# Patient Record
Sex: Male | Born: 1985
Health system: Southern US, Community
[De-identification: ages and names within clinical notes are randomized; demographics above are authoritative.]

## PROBLEM LIST (undated history)

## (undated) DIAGNOSIS — I10 Essential (primary) hypertension: Secondary | ICD-10-CM

---

## 2014-12-07 ENCOUNTER — Emergency Department (INDEPENDENT_AMBULATORY_CARE_PROVIDER_SITE_OTHER)
Admission: EM | Admit: 2014-12-07 | Discharge: 2014-12-07 | Disposition: A | Discharge status: Home / Self Care | Payer: 59 | Source: Home / Self Care | Attending: Family Medicine | Admitting: Family Medicine

## 2014-12-07 ENCOUNTER — Encounter (HOSPITAL_COMMUNITY): Payer: Self-pay | Admitting: Emergency Medicine

## 2014-12-07 DIAGNOSIS — M722 Plantar fascial fibromatosis: Secondary | ICD-10-CM

## 2014-12-07 DIAGNOSIS — S93402A Sprain of unspecified ligament of left ankle, initial encounter: Secondary | ICD-10-CM

## 2014-12-07 NOTE — Discharge Instructions (Signed)
Ankle Sprain An ankle sprain is an injury to the strong, fibrous tissues (ligaments) that hold your ankle bones together.  HOME CARE   Put ice on your ankle for 1-2 days or as told by your doctor.  Put ice in a plastic bag.  Place a towel between your skin and the bag.  Leave the ice on for 15-20 minutes at a time, every 2 hours while you are awake.  Only take medicine as told by your doctor.  Raise (elevate) your injured ankle above the level of your heart as much as possible for 2-3 days.  Use crutches if your doctor tells you to. Slowly put your own weight on the affected ankle. Use the crutches until you can walk without pain.  If you have a plaster splint:  Do not rest it on anything harder than a pillow for 24 hours.  Do not put weight on it.  Do not get it wet.  Take it off to shower or bathe.  If given, use an elastic wrap or support stocking for support. Take the wrap off if your toes lose feeling (numb), tingle, or turn cold or blue.  If you have an air splint:  Add or let out air to make it comfortable.  Take it off at night and to shower and bathe.  Wiggle your toes and move your ankle up and down often while you are wearing it. GET HELP IF:  You have rapidly increasing bruising or puffiness (swelling).  Your toes feel very cold.  You lose feeling in your foot.  Your medicine does not help your pain. GET HELP RIGHT AWAY IF:   Your toes lose feeling (numb) or turn blue.  You have severe pain that is increasing. MAKE SURE YOU:   Understand these instructions.  Will watch your condition.  Will get help right away if you are not doing well or get worse. Document Released: 12/09/2007 Document Revised: 11/06/2013 Document Reviewed: 01/04/2012 [facility] Patient Information 2015 [place], [place]. This information is not intended to replace advice given to you by your health care provider. Make sure you discuss any questions you have with your health care  provider.  Plantar Fasciitis Full length insert with high arch support Plantar fasciitis is a common condition that causes foot pain. It is soreness (inflammation) of the band of tough fibrous tissue on the bottom of the foot that runs from the heel bone (calcaneus) to the ball of the foot. The cause of this soreness may be from excessive standing, poor fitting shoes, running on hard surfaces, being overweight, having an abnormal walk, or overuse (this is common in runners) of the painful foot or feet. It is also common in aerobic exercise dancers and ballet dancers. SYMPTOMS  Most people with plantar fasciitis complain of:  Severe pain in the morning on the bottom of their foot especially when taking the first steps out of bed. This pain recedes after a few minutes of walking.  Severe pain is experienced also during walking following a long period of inactivity.  Pain is worse when walking barefoot or up stairs DIAGNOSIS   Your caregiver will diagnose this condition by examining and feeling your foot.  Special tests such as X-rays of your foot, are usually not needed. PREVENTION   Consult a sports medicine professional before beginning a new exercise program.  Walking programs offer a good workout. With walking there is a lower chance of overuse injuries common to runners. There is less impact and  less jarring of the joints.  Begin all new exercise programs slowly. If problems or pain develop, decrease the amount of time or distance until you are at a comfortable level.  Wear good shoes and replace them regularly.  Stretch your foot and the heel cords at the back of the ankle (Achilles tendon) both before and after exercise.  Run or exercise on even surfaces that are not hard. For example, asphalt is better than pavement.  Do not run barefoot on hard surfaces.  If using a treadmill, vary the incline.  Do not continue to workout if you have foot or joint problems. Seek  professional help if they do not improve. HOME CARE INSTRUCTIONS   Avoid activities that cause you pain until you recover.  Use ice or cold packs on the problem or painful areas after working out.  Only take over-the-counter or prescription medicines for pain, discomfort, or fever as directed by your caregiver.  Soft shoe inserts or athletic shoes with air or gel sole cushions may be helpful.  If problems continue or become more severe, consult a sports medicine caregiver or your own health care provider. Cortisone is a potent anti-inflammatory medication that may be injected into the painful area. You can discuss this treatment with your caregiver. MAKE SURE YOU:   Understand these instructions.  Will watch your condition.  Will get help right away if you are not doing well or get worse. Document Released: 03/17/2001 Document Revised: 09/14/2011 Document Reviewed: 05/16/2008 [facility] Patient Information 2015 [place], [place]. This information is not intended to replace advice given to you by your health care provider. Make sure you discuss any questions you have with your health care provider.

## 2014-12-07 NOTE — ED Notes (Signed)
Pt states that he has had left ankle pain for a week now. Pt denies any injury or fall.

## 2014-12-07 NOTE — ED Provider Notes (Signed)
CSN: [id number]     Arrival date & time 12/07/14  1537 History   First MD Initiated Contact with Patient 12/07/14 1545     Chief Complaint  Patient presents with  . Ankle Pain   (Consider location/radiation/quality/duration/timing/severity/associated sxs/prior Treatment) HPI Comments: 29 year old male complaining of left ankle pain for one week. He states 1 day prior to this he "twisted it a little". The following day is when he experienced the pain. His job entails being on his feet at [employer] for 12 hours a day. In addition to the left medial ankle discomfort he also complains of plantar discomfort with weightbearing. It feels better when he is off weight such as during the night however, when first planning his foot on the floor in the morning he experiences severe plantar pain to the left foot.   History reviewed. No pertinent past medical history. History reviewed. No pertinent past surgical history. History reviewed. No pertinent family history. History  Substance Use Topics  . Smoking status: Current Some Day Smoker  . Smokeless tobacco: Not on file  . Alcohol Use: Yes    Review of Systems  Constitutional: Negative.   Respiratory: Negative.   Genitourinary: Negative.   Musculoskeletal:       As per HPI  Skin: Negative.   Neurological: Negative for numbness and headaches.  All other systems reviewed and are negative.   Allergies  Review of patient's allergies indicates no known allergies.  Home Medications   Prior to Admission medications   Not on File   BP 163/89 mmHg  Pulse 86  Temp(Src) 99.4 F (37.4 C) (Oral)  Resp 20  SpO2 100% Physical Exam  Constitutional: He is oriented to person, place, and time. He appears well-developed and well-nourished. No distress.  HENT:  Head: Normocephalic and atraumatic.  Eyes: EOM are normal. Left eye exhibits no discharge.  Neck: Normal range of motion. Neck supple.  Pulmonary/Chest: Effort normal. No respiratory  distress.  Musculoskeletal:  Left ankle without swelling, discoloration or deformity. Full range of motion. No tenderness to the medial or lateral aspect of the ankle. There is tenderness to the plantar aspect near the heel with deep palpation. Pain is primarily experienced with weightbearing. There is no bony tenderness of the ankle or foot. Distal neurovascular motor sensory is intact. Pedal pulses 2+. Capillary refill is brisk.  Neurological: He is alert and oriented to person, place, and time. No cranial nerve deficit.  Skin: Skin is warm and dry.  Psychiatric: He has a normal mood and affect.  Nursing note and vitals reviewed.   ED Course  Procedures (including critical care time) Labs Review Labs Reviewed - No data to display  Imaging Review No results found.   MDM   1. Plantar fasciitis of left foot   2. Ankle sprain, left, initial encounter     Full length insert with high arch support Limit wt bearing as much as possible Ice as directed Motrin as dir ASO for ankle May need to see podiatrist if not improving If ankle pain persists or worse may return.   Hayden Rasmussenavid Chan Sheahan, NP 12/07/14 [phone]

## 2015-02-18 ENCOUNTER — Emergency Department (INDEPENDENT_AMBULATORY_CARE_PROVIDER_SITE_OTHER)
Admission: EM | Admit: 2015-02-18 | Discharge: 2015-02-18 | Disposition: A | Discharge status: Home / Self Care | Payer: 59 | Source: Home / Self Care | Attending: Family Medicine | Admitting: Family Medicine

## 2015-02-18 DIAGNOSIS — L84 Corns and callosities: Secondary | ICD-10-CM | POA: Diagnosis not present

## 2015-02-18 NOTE — Discharge Instructions (Signed)
Soak in warm salt water at least once a day for 7-10 days, bacitracin ointment twice a day, return as needed.

## 2015-02-18 NOTE — ED Provider Notes (Signed)
CSN: [id number]     Arrival date & time 02/18/15  1551 History   First MD Initiated Contact with Patient 02/18/15 1816     Chief Complaint  Patient presents with  . Foot Pain   (Consider location/radiation/quality/duration/timing/severity/associated sxs/prior Treatment) Patient is a 29 y.o. male presenting with lower extremity pain. The history is provided by the patient.  Foot Pain This is a new problem. The current episode started more than 1 week ago (present for more than 75mo ago worse recently.). The problem has been gradually worsening.    No past medical history on file. No past surgical history on file. No family history on file. Social History  Substance Use Topics  . Smoking status: Current Some Day Smoker  . Smokeless tobacco: Not on file  . Alcohol Use: Yes    Review of Systems  Constitutional: Negative.   Musculoskeletal: Positive for gait problem.  Skin: Positive for wound.    Allergies  Review of patient's allergies indicates no known allergies.  Home Medications   Prior to Admission medications   Not on File   BP 179/99 mmHg  Pulse 75  Temp(Src) 99.7 F (37.6 C) (Oral)  Resp 20  SpO2 100% Physical Exam  Constitutional: He is oriented to person, place, and time. He appears well-developed and well-nourished. He appears distressed.  Musculoskeletal: He exhibits tenderness.  Fluctuant tender corn with assoc callous to plantar surface of right foot.  Neurological: He is alert and oriented to person, place, and time.  Skin: Skin is warm and dry. No erythema.  Nursing note and vitals reviewed.   ED Course  Debridement Date/Time: 02/18/2015 6:44 PM Performed by: Linna Hoff Authorized by: Bradd Canary D Consent: Verbal consent obtained. Risks and benefits: risks, benefits and alternatives were discussed Consent given by: patient Preparation: Patient was prepped and draped in the usual sterile fashion. Local anesthesia used: no Patient sedated:  no Patient tolerance: Patient tolerated the procedure well with no immediate complications Comments: Copious purulent fluid expressed from callous site, betadine soaked, dsd.   (including critical care time) Labs Review Labs Reviewed - No data to display  Imaging Review No results found.   MDM   1. Callus of foot        Linna Hoff, MD 02/18/15 [phone]

## 2015-02-18 NOTE — ED Notes (Signed)
Pt  Has    A  Callus      On the  Bottom of     The       r  Foot  That  Has  Been there     X  4  Weeks

## 2017-02-26 ENCOUNTER — Encounter (HOSPITAL_COMMUNITY): Payer: Self-pay | Admitting: *Deleted

## 2017-02-26 ENCOUNTER — Ambulatory Visit (HOSPITAL_COMMUNITY)
Admission: EM | Admit: 2017-02-26 | Discharge: 2017-02-26 | Disposition: A | Discharge status: Home / Self Care | Payer: 59

## 2017-02-26 DIAGNOSIS — K5901 Slow transit constipation: Secondary | ICD-10-CM

## 2017-02-26 DIAGNOSIS — R109 Unspecified abdominal pain: Secondary | ICD-10-CM

## 2017-02-26 DIAGNOSIS — K594 Anal spasm: Secondary | ICD-10-CM

## 2017-02-26 NOTE — ED Provider Notes (Signed)
[facility]    CSN: [id number] Arrival date & time: 02/26/17  1310     History   Chief Complaint Chief Complaint  Patient presents with  . Abdominal Cramping    HPI Darius Joyce is a 31 y.o. male.   Obese 31 year old male complaining of lower abdominal pain and cramping yesterday. He was straining to have a bowel movement and describes muscle spasms in the rectum. Discomfort a little bit better today. He states he feels like he has been having normal bowel movements but has a history of constipation.      History reviewed. No pertinent past medical history.  There are no active problems to display for this patient.   History reviewed. No pertinent surgical history.     Home Medications    Prior to Admission medications   Not on File    Family History History reviewed. No pertinent family history.  Social History Social History  Substance Use Topics  . Smoking status: Former Games developer  . Smokeless tobacco: Never Used  . Alcohol use Yes     Allergies   Patient has no known allergies.   Review of Systems Review of Systems  Constitutional: Negative.   Respiratory: Negative.   Cardiovascular: Negative.   Gastrointestinal: Positive for abdominal pain. Negative for blood in stool, nausea and vomiting.  Musculoskeletal: Negative.   Neurological: Negative.   All other systems reviewed and are negative.    Physical Exam Triage Vital Signs ED Triage Vitals  Enc Vitals Group     BP 02/26/17 1358 (!) 149/89     Pulse Rate 02/26/17 1358 79     Resp 02/26/17 1358 17     Temp 02/26/17 1358 98 F (36.7 C)     Temp Source 02/26/17 1358 Oral     SpO2 02/26/17 1358 99 %     Weight --      Height --      Head Circumference --      Peak Flow --      Pain Score 02/26/17 1356 4     Pain Loc --      Pain Edu? --      Excl. in GC? --    No data found.   Updated Vital Signs BP (!) 149/89 (BP Location: Left Arm)   Pulse 79   Temp 98 F  (36.7 C) (Oral)   Resp 17   SpO2 99%   Visual Acuity Right Eye Distance:   Left Eye Distance:   Bilateral Distance:    Right Eye Near:   Left Eye Near:    Bilateral Near:     Physical Exam  Constitutional: He is oriented to person, place, and time. He appears well-developed and well-nourished. No distress.  Neck: Neck supple.  Cardiovascular: Normal rate.   Pulmonary/Chest: Effort normal.  Abdominal: Soft. Bowel sounds are normal. He exhibits no distension.  Mild tenderness across lower abdomen. No guarding or rebound. Most of abdomen percusses dull to flat.  Neurological: He is alert and oriented to person, place, and time.  Skin: Skin is warm.  Psychiatric: He has a normal mood and affect.  Nursing note and vitals reviewed.    UC Treatments / Results  Labs (all labs ordered are listed, but only abnormal results are displayed) Labs Reviewed - No data to display  EKG  EKG Interpretation None       Radiology No results found.  Procedures Procedures (including critical care time)  Medications Ordered in UC  Medications - No data to display   Initial Impression / Assessment and Plan / UC Course  I have reviewed the triage vital signs and the nursing notes.  Pertinent labs & imaging results that were available during my care of the patient were reviewed by me and considered in my medical decision making (see chart for details).    Take the MiraLAX as directed. 3 glasses and one and half hours. If results are not adequate in 6 hours repeat 2 glasses and again in 6 hours repeat 2 glasses if needed. Increase the fiber in your diet.    Final Clinical Impressions(s) / UC Diagnoses   Final diagnoses:  Abdominal cramping  Slow transit constipation  Paroxysmal proctalgia    New Prescriptions New Prescriptions   No medications on file     Controlled Substance Prescriptions [place] Controlled Substance Registry consulted? Not Applicable   Hayden Rasmussen,  NP 02/26/17 [phone]

## 2017-02-26 NOTE — Discharge Instructions (Signed)
Take the MiraLAX as directed. 3 glasses and one and half hours. If results are not adequate in 6 hours repeat 2 glasses and again in 6 hours repeat 2 glasses if needed. Increase the fiber in your diet.

## 2017-02-26 NOTE — ED Triage Notes (Signed)
Patient reports after going to the bathroom yesterday he developed lower abdominal cramping. Reports it was sharp in nature and radiated to back and anus. States this pain has went away but he is still having intermittent abdominal cramping.

## 2019-05-31 ENCOUNTER — Other Ambulatory Visit: Payer: Self-pay

## 2019-05-31 DIAGNOSIS — Z20822 Contact with and (suspected) exposure to covid-19: Secondary | ICD-10-CM

## 2019-06-02 LAB — NOVEL CORONAVIRUS, NAA: SARS-CoV-2, NAA: NOT DETECTED

## 2020-02-17 ENCOUNTER — Other Ambulatory Visit: Payer: Self-pay

## 2020-02-17 ENCOUNTER — Encounter: Payer: Self-pay | Admitting: *Deleted

## 2020-02-17 ENCOUNTER — Ambulatory Visit
Admission: EM | Admit: 2020-02-17 | Discharge: 2020-02-17 | Disposition: A | Discharge status: Home / Self Care | Payer: Self-pay | Attending: Emergency Medicine | Admitting: Emergency Medicine

## 2020-02-17 DIAGNOSIS — R112 Nausea with vomiting, unspecified: Secondary | ICD-10-CM

## 2020-02-17 MED ORDER — AFRIN NASAL SPRAY 0.05 % NA SOLN: 1.0000 | Freq: Two times a day (BID) | NASAL | 0 refills | Status: DC

## 2020-02-17 MED ORDER — CETIRIZINE HCL 10 MG PO TABS: 10.0000 mg | ORAL_TABLET | Freq: Every day | ORAL | 0 refills | Status: DC

## 2020-02-17 MED ORDER — BENZONATATE 100 MG PO CAPS: 100.0000 mg | ORAL_CAPSULE | Freq: Three times a day (TID) | ORAL | 0 refills | Status: DC

## 2020-02-17 MED ORDER — ONDANSETRON 4 MG PO TBDP: 4.0000 mg | ORAL_TABLET | Freq: Three times a day (TID) | ORAL | 0 refills | Status: DC | PRN

## 2020-02-17 NOTE — Discharge Instructions (Addendum)
Your COVID test is pending - it is important to quarantine / isolate at home until your results are back. °If you test positive and would like further evaluation for persistent or worsening symptoms, you may schedule an E-visit or virtual (video) visit throughout the [facility] MyChart app or website. ° °PLEASE NOTE: If you develop severe chest pain or shortness of breath please go to the ER or call 9-1-1 for further evaluation --> DO NOT schedule electronic or virtual visits for this. °Please call our office for further guidance / recommendations as needed. ° °For information about the Covid vaccine, please visit [email or web address] °

## 2020-02-17 NOTE — ED Provider Notes (Signed)
[facility]    CSN: [id number] Arrival date & time: 02/17/20  1349      History   Chief Complaint Chief Complaint  Patient presents with  . Emesis  . Epistaxis    HPI Darius Joyce is a 34 y.o. male  Presenting for emesis x3 since yesterday.  Patient notes a little bit of watery diarrhea without blood or melena for last few days as well.  No abdominal pain, chest pain, cough, difficulty breathing.  States that after vomiting yesterday he had a single episode of nosebleed: Controlled with direct pressure.  Has not occurred since.  Able to tolerate liquids orally.  History reviewed. No pertinent past medical history.  There are no problems to display for this patient.   History reviewed. No pertinent surgical history.     Home Medications    Prior to Admission medications   Medication Sig Start Date End Date Taking? Authorizing Provider  benzonatate (TESSALON) 100 MG capsule Take 1 capsule (100 mg total) by mouth every 8 (eight) hours. 02/17/20   Hall-Potvin, [place], PA-C  cetirizine (ZYRTEC ALLERGY) 10 MG tablet Take 1 tablet (10 mg total) by mouth daily. 02/17/20   Hall-Potvin, [place], PA-C  ondansetron (ZOFRAN ODT) 4 MG disintegrating tablet Take 1 tablet (4 mg total) by mouth every 8 (eight) hours as needed for nausea or vomiting. 02/17/20   Hall-Potvin, [place], PA-C  oxymetazoline (AFRIN NASAL SPRAY) 0.05 % nasal spray Place 1 spray into both nostrils 2 (two) times daily. 02/17/20   Hall-Potvin, [place], PA-C    Family History Family History  Problem Relation Age of Onset  . Hypertension Mother     Social History Social History   Tobacco Use  . Smoking status: Former Games developer  . Smokeless tobacco: Never Used  Vaping Use  . Vaping Use: Never used  Substance Use Topics  . Alcohol use: Yes    Comment: rarely  . Drug use: Yes    Types: Marijuana     Allergies   Patient has no known allergies.   Review of Systems As per  HPI   Physical Exam Triage Vital Signs ED Triage Vitals  Enc Vitals Group     BP 02/17/20 1518 (!) 173/115     Pulse Rate 02/17/20 1518 70     Resp 02/17/20 1518 18     Temp 02/17/20 1518 100.2 F (37.9 C)     Temp Source 02/17/20 1518 Oral     SpO2 02/17/20 1518 97 %     Weight --      Height --      Head Circumference --      Peak Flow --      Pain Score 02/17/20 1531 0     Pain Loc --      Pain Edu? --      Excl. in GC? --    No data found.  Updated Vital Signs BP (S) (!) 177/107 Comment: Reports recent HTN; no meds prescribed  Pulse 70   Temp 100.2 F (37.9 C) (Oral)   Resp 18   SpO2 97%   Visual Acuity Right Eye Distance:   Left Eye Distance:   Bilateral Distance:    Right Eye Near:   Left Eye Near:    Bilateral Near:     Physical Exam Constitutional:      General: He is not in acute distress.    Appearance: He is obese. He is not ill-appearing.  HENT:     Head:  Normocephalic and atraumatic.     Nose: Nose normal.     Mouth/Throat:     Mouth: Mucous membranes are moist.     Pharynx: Oropharynx is clear.  Eyes:     General: No scleral icterus.    Pupils: Pupils are equal, round, and reactive to light.  Cardiovascular:     Rate and Rhythm: Normal rate and regular rhythm.  Pulmonary:     Effort: Pulmonary effort is normal. No respiratory distress.     Breath sounds: No wheezing.  Abdominal:     General: Bowel sounds are normal.     Palpations: Abdomen is soft.     Tenderness: There is no abdominal tenderness.  Skin:    Coloration: Skin is not jaundiced or pale.  Neurological:     Mental Status: He is alert and oriented to person, place, and time.      UC Treatments / Results  Labs (all labs ordered are listed, but only abnormal results are displayed) Labs Reviewed  NOVEL CORONAVIRUS, NAA    EKG   Radiology No results found.  Procedures Procedures (including critical care time)  Medications Ordered in UC Medications - No data  to display  Initial Impression / Assessment and Plan / UC Course  I have reviewed the triage vital signs and the nursing notes.  Pertinent labs & imaging results that were available during my care of the patient were reviewed by me and considered in my medical decision making (see chart for details).     Patient afebrile, nontoxic, with SpO2 97%.  Covid PCR pending.  Patient to quarantine until results are back.  We will treat supportively as outlined below.  Return precautions discussed, patient verbalized understanding and is agreeable to plan. Final Clinical Impressions(s) / UC Diagnoses   Final diagnoses:  Nausea and vomiting, intractability of vomiting not specified, unspecified vomiting type     Discharge Instructions     Your COVID test is pending - it is important to quarantine / isolate at home until your results are back. If you test positive and would like further evaluation for persistent or worsening symptoms, you may schedule an E-visit or virtual (video) visit throughout the [facility] app or website.  PLEASE NOTE: If you develop severe chest pain or shortness of breath please go to the ER or call 9-1-1 for further evaluation --> DO NOT schedule electronic or virtual visits for this. Please call our office for further guidance / recommendations as needed.  For information about the Covid vaccine, please visit [email or web address]    ED Prescriptions    Medication Sig Dispense Auth. Provider   benzonatate (TESSALON) 100 MG capsule Take 1 capsule (100 mg total) by mouth every 8 (eight) hours. 21 capsule Hall-Potvin, [place], PA-C   ondansetron (ZOFRAN ODT) 4 MG disintegrating tablet Take 1 tablet (4 mg total) by mouth every 8 (eight) hours as needed for nausea or vomiting. 21 tablet Hall-Potvin, [place], PA-C   cetirizine (ZYRTEC ALLERGY) 10 MG tablet Take 1 tablet (10 mg total) by mouth daily. 30 tablet Hall-Potvin, [place], PA-C   oxymetazoline (AFRIN  NASAL SPRAY) 0.05 % nasal spray Place 1 spray into both nostrils 2 (two) times daily. 30 mL Hall-Potvin, [place], PA-C     PDMP not reviewed this encounter.   Hall-Potvin, [place], [place] 02/18/20 1339

## 2020-02-17 NOTE — ED Triage Notes (Signed)
Reports vomiting x 3 yesterday; reports "little bit of diarrhea" 2 days ago and today.  States one vomiting episode caused epistaxis.  No epistaxis.  States feeling better today, but describes "not feeling 100%".  Denies pain.

## 2020-02-18 LAB — SARS-COV-2, NAA 2 DAY TAT

## 2020-02-18 LAB — NOVEL CORONAVIRUS, NAA: SARS-CoV-2, NAA: DETECTED — AB

## 2020-02-19 ENCOUNTER — Telehealth (HOSPITAL_COMMUNITY): Payer: Self-pay | Admitting: Emergency Medicine

## 2020-02-19 MED ORDER — ONDANSETRON 4 MG PO TBDP: 4.0000 mg | ORAL_TABLET | Freq: Three times a day (TID) | ORAL | 0 refills | Status: DC | PRN

## 2020-02-19 MED ORDER — CETIRIZINE HCL 10 MG PO TABS: 10.0000 mg | ORAL_TABLET | Freq: Every day | ORAL | 0 refills | Status: DC

## 2020-02-19 MED ORDER — BENZONATATE 100 MG PO CAPS: 100.0000 mg | ORAL_CAPSULE | Freq: Three times a day (TID) | ORAL | 0 refills | Status: DC

## 2020-02-19 MED ORDER — AFRIN NASAL SPRAY 0.05 % NA SOLN: 1.0000 | Freq: Two times a day (BID) | NASAL | 0 refills | Status: DC

## 2020-02-19 NOTE — Telephone Encounter (Signed)
Patient called for COVID results and states he needs his medicines sent to another pharmacy.  Resent to pharmacy of request

## 2020-02-27 ENCOUNTER — Other Ambulatory Visit: Payer: Self-pay

## 2020-02-27 DIAGNOSIS — Z20822 Contact with and (suspected) exposure to covid-19: Secondary | ICD-10-CM

## 2020-02-28 LAB — SPECIMEN STATUS REPORT

## 2020-02-28 LAB — SARS-COV-2, NAA 2 DAY TAT

## 2020-02-28 LAB — NOVEL CORONAVIRUS, NAA: SARS-CoV-2, NAA: DETECTED — AB

## 2020-03-13 ENCOUNTER — Other Ambulatory Visit: Payer: Self-pay

## 2020-03-13 ENCOUNTER — Other Ambulatory Visit: Payer: Self-pay | Admitting: Critical Care Medicine

## 2020-03-13 ENCOUNTER — Inpatient Hospital Stay: Payer: Self-pay | Admitting: Physician Assistant

## 2020-03-13 DIAGNOSIS — Z20822 Contact with and (suspected) exposure to covid-19: Secondary | ICD-10-CM

## 2020-03-13 NOTE — Progress Notes (Deleted)
Patient ID: Darius Joyce, male   DOB: 02-Feb-1986, 34 y.o.   MRN: [id number]   Virtual Visit via Telephone Note  I connected with Darius Joyce on 03/14/20 at  8:50 AM EDT by telephone and verified that I am speaking with the correct person using two identifiers.   I discussed the limitations, risks, security and privacy concerns of performing an evaluation and management service by telephone and the availability of in person appointments. I also discussed with the patient that there may be a patient responsible charge related to this service. The patient expressed understanding and agreed to proceed.   History of Present Illness: After ED visit 02/17/2020 then tested Positive for Covid 19 02/27/2020.  From ED note 8/14: Presenting for emesis x3 since yesterday.  Patient notes a little bit of watery diarrhea without blood or melena for last few days as well.  No abdominal pain, chest pain, cough, difficulty breathing.  States that after vomiting yesterday he had a single episode of nosebleed: Controlled with direct pressure.  Has not occurred since.  Able to tolerate liquids orally.    Observations/Objective:   Assessment and Plan:   Follow Up Instructions:    I discussed the assessment and treatment plan with the patient. The patient was provided an opportunity to ask questions and all were answered. The patient agreed with the plan and demonstrated an understanding of the instructions.   The patient was advised to call back or seek an in-person evaluation if the symptoms worsen or if the condition fails to improve as anticipated.  I provided *** minutes of non-face-to-face time during this encounter.   Georgian Co, PA-C

## 2020-03-14 ENCOUNTER — Inpatient Hospital Stay: Payer: Self-pay | Admitting: Physician Assistant

## 2020-03-15 LAB — SARS-COV-2, NAA 2 DAY TAT

## 2020-03-15 LAB — NOVEL CORONAVIRUS, NAA: SARS-CoV-2, NAA: NOT DETECTED

## 2020-12-18 ENCOUNTER — Ambulatory Visit (HOSPITAL_COMMUNITY)
Admission: EM | Admit: 2020-12-18 | Discharge: 2020-12-18 | Disposition: A | Discharge status: Home / Self Care | Payer: Self-pay | Attending: Internal Medicine | Admitting: Internal Medicine

## 2020-12-18 ENCOUNTER — Encounter (HOSPITAL_COMMUNITY): Payer: Self-pay

## 2020-12-18 ENCOUNTER — Other Ambulatory Visit: Payer: Self-pay

## 2020-12-18 ENCOUNTER — Ambulatory Visit (INDEPENDENT_AMBULATORY_CARE_PROVIDER_SITE_OTHER): Payer: Self-pay

## 2020-12-18 DIAGNOSIS — J189 Pneumonia, unspecified organism: Secondary | ICD-10-CM

## 2020-12-18 DIAGNOSIS — M940 Chondrocostal junction syndrome [Tietze]: Secondary | ICD-10-CM

## 2020-12-18 DIAGNOSIS — R0602 Shortness of breath: Secondary | ICD-10-CM

## 2020-12-18 MED ORDER — AMOXICILLIN-POT CLAVULANATE 875-125 MG PO TABS: 1.0000 | ORAL_TABLET | Freq: Two times a day (BID) | ORAL | 0 refills | Status: DC

## 2020-12-18 NOTE — ED Provider Notes (Signed)
[facility]    CSN: [id number] Arrival date & time: 12/18/20  1350      History   Chief Complaint Chief Complaint  Patient presents with   Chest Pain   Shortness of Breath    HPI Darius Joyce is a 35 y.o. male.   Patient here for evaluation of left-sided chest pain that started this AM.  Reports having similar pain on Thursday but states symptoms resolved on their own.  Reports pain worse with movement and that "it feels like something stabbing or punching chest".  Denies any recent sick contacts.  Denies any trauma, injury, or other precipitating event.  Denies any specific alleviating or aggravating factors.  Denies any fevers, chest pain, shortness of breath, N/V/D, numbness, tingling, weakness, abdominal pain, or headaches.     The history is provided by the patient.  Chest Pain Associated symptoms: shortness of breath   Shortness of Breath Associated symptoms: chest pain    History reviewed. No pertinent past medical history.  There are no problems to display for this patient.   History reviewed. No pertinent surgical history.     Home Medications    Prior to Admission medications   Medication Sig Start Date End Date Taking? Authorizing Provider  amoxicillin-clavulanate (AUGMENTIN) 875-125 MG tablet Take 1 tablet by mouth every 12 (twelve) hours for 5 days. 12/18/20 12/23/20 Yes Ivette Loyal, NP  benzonatate (TESSALON) 100 MG capsule Take 1 capsule (100 mg total) by mouth every 8 (eight) hours. 02/19/20   Merrilee Jansky, MD  cetirizine (ZYRTEC ALLERGY) 10 MG tablet Take 1 tablet (10 mg total) by mouth daily. 02/19/20   Merrilee Jansky, MD  ondansetron (ZOFRAN ODT) 4 MG disintegrating tablet Take 1 tablet (4 mg total) by mouth every 8 (eight) hours as needed for nausea or vomiting. 02/19/20   Lamptey, Britta Mccreedy, MD  oxymetazoline (AFRIN NASAL SPRAY) 0.05 % nasal spray Place 1 spray into both nostrils 2 (two) times daily. 02/19/20   Lamptey, Britta Mccreedy,  MD    Family History Family History  Problem Relation Age of Onset   Hypertension Mother    Hypertension Father     Social History Social History   Tobacco Use   Smoking status: Former    Pack years: 0.00   Smokeless tobacco: Never  Vaping Use   Vaping Use: Never used  Substance Use Topics   Alcohol use: Yes    Comment: rarely   Drug use: Yes    Types: Marijuana    Comment: smokes daily     Allergies   Patient has no known allergies.   Review of Systems Review of Systems  Respiratory:  Positive for shortness of breath.   Cardiovascular:  Positive for chest pain.  All other systems reviewed and are negative.   Physical Exam Triage Vital Signs ED Triage Vitals  Enc Vitals Group     BP 12/18/20 1410 (!) 167/97     Pulse Rate 12/18/20 1410 81     Resp 12/18/20 1410 18     Temp 12/18/20 1410 (!) 97 F (36.1 C)     Temp src --      SpO2 12/18/20 1410 99 %     Weight --      Height --      Head Circumference --      Peak Flow --      Pain Score 12/18/20 1403 5     Pain Loc --  Pain Edu? --      Excl. in GC? --    No data found.  Updated Vital Signs BP (!) 167/97   Pulse 81   Temp (!) 97 F (36.1 C)   Resp 18   SpO2 99%   Visual Acuity Right Eye Distance:   Left Eye Distance:   Bilateral Distance:    Right Eye Near:   Left Eye Near:    Bilateral Near:     Physical Exam Vitals and nursing note reviewed.  Constitutional:      General: He is not in acute distress.    Appearance: Normal appearance. He is not ill-appearing, toxic-appearing or diaphoretic.  HENT:     Head: Normocephalic and atraumatic.  Eyes:     Conjunctiva/sclera: Conjunctivae normal.  Cardiovascular:     Rate and Rhythm: Normal rate and regular rhythm.     Pulses: Normal pulses.          Carotid pulses are 2+ on the right side and 2+ on the left side.      Radial pulses are 2+ on the right side and 2+ on the left side.       Dorsalis pedis pulses are 2+ on the right  side and 2+ on the left side.       Posterior tibial pulses are 2+ on the right side and 2+ on the left side.     Heart sounds: Normal heart sounds.  Pulmonary:     Effort: Pulmonary effort is normal.     Breath sounds: Normal breath sounds. No decreased breath sounds, wheezing, rhonchi or rales.  Chest:     Chest wall: Tenderness (left upper chest) present.  Abdominal:     General: Abdomen is flat.  Musculoskeletal:        General: Normal range of motion.     Cervical back: Normal range of motion.  Skin:    General: Skin is warm and dry.  Neurological:     General: No focal deficit present.     Mental Status: He is alert and oriented to person, place, and time.  Psychiatric:        Mood and Affect: Mood normal.     UC Treatments / Results  Labs (all labs ordered are listed, but only abnormal results are displayed) Labs Reviewed - No data to display  EKG   Radiology DG Chest 2 View  Result Date: 12/18/2020 CLINICAL DATA:  Short of breath EXAM: CHEST - 2 VIEW COMPARISON:  None. FINDINGS: Multifocal airspace disease bilaterally left greater than right. No pleural effusion. Lung volume normal. Heart size and vascularity normal. IMPRESSION: Extensive multifocal airspace disease left greater than right most likely pneumonia. Electronically Signed   By: Marlan Palau M.D.   On: 12/18/2020 14:56    Procedures Procedures (including critical care time)  Medications Ordered in UC Medications - No data to display  Initial Impression / Assessment and Plan / UC Course  I have reviewed the triage vital signs and the nursing notes.  Pertinent labs & imaging results that were available during my care of the patient were reviewed by me and considered in my medical decision making (see chart for details).    EKG normal sinus rhythm with normal rate and rhythm.  X-ray shows extensive multifocal airspace disease left greater than right most likely pneumonia.  We will treat with Augmentin  twice a day for the next 5 days.  Patient also likely has costochondritis and will treat with  ibuprofen as needed for pain.  Encourage fluids and rest.  Discussed conservative symptom management as described in discharge instructions.  Recommend following up in 1 month for repeat chest x-ray.  Follow-up with primary care as needed.  Final Clinical Impressions(s) / UC Diagnoses   Final diagnoses:  Pneumonia due to infectious organism, unspecified laterality, unspecified part of lung  Costochondritis     Discharge Instructions      Take the Augmentin twice a day for the next 5 days.   You can take Tylenol and/or Ibuprofen as needed for fever reduction and pain relief.    For cough: honey 1/2 to 1 teaspoon (you can dilute the honey in water or another fluid).  You can also use guaifenesin and dextromethorphan for cough. You can use a humidifier for chest congestion and cough.  If you don't have a humidifier, you can sit in the bathroom with the hot shower running.     For sore throat: try warm salt water gargles, cepacol lozenges, throat spray, warm tea or water with lemon/honey, popsicles or ice, or OTC cold relief medicine for throat discomfort.    For congestion: take a daily anti-histamine like Zyrtec, Claritin, and a oral decongestant, such as pseudoephedrine.  You can also use Flonase 1-2 sprays in each nostril daily.    It is important to stay hydrated: drink plenty of fluids (water, gatorade/powerade/pedialyte, juices, or teas) to keep your throat moisturized and help further relieve irritation/discomfort.   Return or go to the Emergency Department if symptoms worsen or do not improve in the next few days.      ED Prescriptions     Medication Sig Dispense Auth. Provider   amoxicillin-clavulanate (AUGMENTIN) 875-125 MG tablet Take 1 tablet by mouth every 12 (twelve) hours for 5 days. 10 tablet Ivette Loyal, NP      PDMP not reviewed this encounter.   Ivette Loyal,  NP 12/18/20 [phone]

## 2020-12-18 NOTE — Discharge Instructions (Addendum)
Take the Augmentin twice a day for the next 5 days.   You can take Tylenol and/or Ibuprofen as needed for fever reduction and pain relief.    For cough: honey 1/2 to 1 teaspoon (you can dilute the honey in water or another fluid).  You can also use guaifenesin and dextromethorphan for cough. You can use a humidifier for chest congestion and cough.  If you don't have a humidifier, you can sit in the bathroom with the hot shower running.     For sore throat: try warm salt water gargles, cepacol lozenges, throat spray, warm tea or water with lemon/honey, popsicles or ice, or OTC cold relief medicine for throat discomfort.    For congestion: take a daily anti-histamine like Zyrtec, Claritin, and a oral decongestant, such as pseudoephedrine.  You can also use Flonase 1-2 sprays in each nostril daily.    It is important to stay hydrated: drink plenty of fluids (water, gatorade/powerade/pedialyte, juices, or teas) to keep your throat moisturized and help further relieve irritation/discomfort.   Return or go to the Emergency Department if symptoms worsen or do not improve in the next few days.

## 2020-12-18 NOTE — ED Triage Notes (Signed)
Pt reports chest pain starting Thursday, which went away and came back this morning. States the pain comes and goes at random and that pain is left sided. States it feels like "something is punching my lung, it's hard to breathe". Pt does state pain at times is worse with deep inspiration but states he also feels it at times when not breathing.

## 2020-12-22 ENCOUNTER — Inpatient Hospital Stay (HOSPITAL_COMMUNITY)
Admission: EM | Admit: 2020-12-22 | Discharge: 2020-12-24 | DRG: 197 | Disposition: A | Discharge status: Home / Self Care | Payer: Self-pay | Attending: Family Medicine | Admitting: Family Medicine

## 2020-12-22 ENCOUNTER — Encounter (HOSPITAL_COMMUNITY): Payer: Self-pay | Admitting: *Deleted

## 2020-12-22 ENCOUNTER — Other Ambulatory Visit: Payer: Self-pay

## 2020-12-22 ENCOUNTER — Emergency Department (HOSPITAL_COMMUNITY): Payer: Self-pay

## 2020-12-22 DIAGNOSIS — R0600 Dyspnea, unspecified: Secondary | ICD-10-CM | POA: Diagnosis present

## 2020-12-22 DIAGNOSIS — I10 Essential (primary) hypertension: Secondary | ICD-10-CM

## 2020-12-22 DIAGNOSIS — R918 Other nonspecific abnormal finding of lung field: Secondary | ICD-10-CM

## 2020-12-22 DIAGNOSIS — Z8249 Family history of ischemic heart disease and other diseases of the circulatory system: Secondary | ICD-10-CM

## 2020-12-22 DIAGNOSIS — R0781 Pleurodynia: Secondary | ICD-10-CM | POA: Diagnosis present

## 2020-12-22 DIAGNOSIS — Z20822 Contact with and (suspected) exposure to covid-19: Secondary | ICD-10-CM | POA: Diagnosis present

## 2020-12-22 DIAGNOSIS — R590 Localized enlarged lymph nodes: Secondary | ICD-10-CM | POA: Diagnosis present

## 2020-12-22 DIAGNOSIS — Z87891 Personal history of nicotine dependence: Secondary | ICD-10-CM

## 2020-12-22 DIAGNOSIS — I509 Heart failure, unspecified: Secondary | ICD-10-CM

## 2020-12-22 DIAGNOSIS — D862 Sarcoidosis of lung with sarcoidosis of lymph nodes: Principal | ICD-10-CM | POA: Diagnosis present

## 2020-12-22 DIAGNOSIS — E559 Vitamin D deficiency, unspecified: Secondary | ICD-10-CM

## 2020-12-22 DIAGNOSIS — J84178 Other interstitial pulmonary diseases with fibrosis in diseases classified elsewhere: Secondary | ICD-10-CM | POA: Diagnosis present

## 2020-12-22 DIAGNOSIS — F1290 Cannabis use, unspecified, uncomplicated: Secondary | ICD-10-CM | POA: Diagnosis present

## 2020-12-22 DIAGNOSIS — Z6841 Body Mass Index (BMI) 40.0 and over, adult: Secondary | ICD-10-CM

## 2020-12-22 DIAGNOSIS — J189 Pneumonia, unspecified organism: Secondary | ICD-10-CM

## 2020-12-22 DIAGNOSIS — E669 Obesity, unspecified: Secondary | ICD-10-CM | POA: Diagnosis present

## 2020-12-22 DIAGNOSIS — Z419 Encounter for procedure for purposes other than remedying health state, unspecified: Secondary | ICD-10-CM

## 2020-12-22 DIAGNOSIS — J181 Lobar pneumonia, unspecified organism: Secondary | ICD-10-CM | POA: Diagnosis present

## 2020-12-22 LAB — CBC
HCT: 44.5 % (ref 39.0–52.0)
Hemoglobin: 14.9 g/dL (ref 13.0–17.0)
MCH: 28.7 pg (ref 26.0–34.0)
MCHC: 33.5 g/dL (ref 30.0–36.0)
MCV: 85.7 fL (ref 80.0–100.0)
Platelets: 329 10*3/uL (ref 150–400)
RBC: 5.19 MIL/uL (ref 4.22–5.81)
RDW: 13.8 % (ref 11.5–15.5)
WBC: 6 10*3/uL (ref 4.0–10.5)
nRBC: 0 % (ref 0.0–0.2)

## 2020-12-22 LAB — DIFFERENTIAL
Abs Immature Granulocytes: 0.02 10*3/uL (ref 0.00–0.07)
Basophils Absolute: 0 10*3/uL (ref 0.0–0.1)
Basophils Relative: 0 %
Eosinophils Absolute: 0.1 10*3/uL (ref 0.0–0.5)
Eosinophils Relative: 1 %
Immature Granulocytes: 0 %
Lymphocytes Relative: 14 %
Lymphs Abs: 1.2 10*3/uL (ref 0.7–4.0)
Monocytes Absolute: 1.2 10*3/uL — ABNORMAL HIGH (ref 0.1–1.0)
Monocytes Relative: 14 %
Neutro Abs: 5.8 10*3/uL (ref 1.7–7.7)
Neutrophils Relative %: 71 %

## 2020-12-22 LAB — RAPID URINE DRUG SCREEN, HOSP PERFORMED
Amphetamines: NOT DETECTED
Barbiturates: NOT DETECTED
Benzodiazepines: NOT DETECTED
Cocaine: NOT DETECTED
Opiates: NOT DETECTED
Tetrahydrocannabinol: POSITIVE — AB

## 2020-12-22 LAB — COMPREHENSIVE METABOLIC PANEL
ALT: 21 U/L (ref 0–44)
AST: 22 U/L (ref 15–41)
Albumin: 3.1 g/dL — ABNORMAL LOW (ref 3.5–5.0)
Alkaline Phosphatase: 62 U/L (ref 38–126)
Anion gap: 7 (ref 5–15)
BUN: 9 mg/dL (ref 6–20)
CO2: 25 mmol/L (ref 22–32)
Calcium: 8.6 mg/dL — ABNORMAL LOW (ref 8.9–10.3)
Chloride: 100 mmol/L (ref 98–111)
Creatinine, Ser: 0.86 mg/dL (ref 0.61–1.24)
GFR, Estimated: >60 mL/min (ref >60)
Glucose, Bld: 99 mg/dL (ref 70–99)
Potassium: 3.8 mmol/L (ref 3.5–5.1)
Sodium: 132 mmol/L — ABNORMAL LOW (ref 135–145)
Total Bilirubin: 0.6 mg/dL (ref 0.3–1.2)
Total Protein: 6.8 g/dL (ref 6.5–8.1)

## 2020-12-22 LAB — VITAMIN D 25 HYDROXY (VIT D DEFICIENCY, FRACTURES): Vit D, 25-Hydroxy: 13.67 ng/mL — ABNORMAL LOW (ref 30–100)

## 2020-12-22 LAB — URINALYSIS, ROUTINE W REFLEX MICROSCOPIC
Bilirubin Urine: NEGATIVE
Glucose, UA: NEGATIVE mg/dL
Ketones, ur: NEGATIVE mg/dL
Leukocytes,Ua: NEGATIVE
Nitrite: NEGATIVE
Protein, ur: NEGATIVE mg/dL
Specific Gravity, Urine: 1.01 (ref 1.005–1.030)
pH: 5.5 (ref 5.0–8.0)

## 2020-12-22 LAB — RESPIRATORY PANEL BY PCR

## 2020-12-22 LAB — HIV ANTIBODY (ROUTINE TESTING W REFLEX): HIV Screen 4th Generation wRfx: NONREACTIVE

## 2020-12-22 LAB — URINALYSIS, MICROSCOPIC (REFLEX): Bacteria, UA: NONE SEEN

## 2020-12-22 LAB — BASIC METABOLIC PANEL
Anion gap: 8 (ref 5–15)
BUN: 10 mg/dL (ref 6–20)
CO2: 27 mmol/L (ref 22–32)
Calcium: 9.2 mg/dL (ref 8.9–10.3)
Chloride: 104 mmol/L (ref 98–111)
Creatinine, Ser: 0.98 mg/dL (ref 0.61–1.24)
GFR, Estimated: >60 mL/min (ref >60)
Glucose, Bld: 119 mg/dL — ABNORMAL HIGH (ref 70–99)
Potassium: 4.1 mmol/L (ref 3.5–5.1)
Sodium: 139 mmol/L (ref 135–145)

## 2020-12-22 LAB — RESP PANEL BY RT-PCR (FLU A&B, COVID) ARPGX2
Influenza A by PCR: NEGATIVE
Influenza B by PCR: NEGATIVE
SARS Coronavirus 2 by RT PCR: NEGATIVE

## 2020-12-22 LAB — TROPONIN I (HIGH SENSITIVITY)
Troponin I (High Sensitivity): 5 ng/L (ref <18)
Troponin I (High Sensitivity): 5 ng/L (ref <18)

## 2020-12-22 LAB — PROTIME-INR
INR: 1 (ref 0.8–1.2)
Prothrombin Time: 13.5 seconds (ref 11.4–15.2)

## 2020-12-22 LAB — C-REACTIVE PROTEIN: CRP: 4.3 mg/dL — ABNORMAL HIGH (ref <1.0)

## 2020-12-22 LAB — PROCALCITONIN: Procalcitonin: <0.1 ng/mL

## 2020-12-22 LAB — LACTIC ACID, PLASMA: Lactic Acid, Venous: 1.1 mmol/L (ref 0.5–1.9)

## 2020-12-22 LAB — APTT: aPTT: 25 seconds (ref 24–36)

## 2020-12-22 LAB — SEDIMENTATION RATE: Sed Rate: 45 mm/hr — ABNORMAL HIGH (ref 0–16)

## 2020-12-22 MED ORDER — POLYETHYLENE GLYCOL 3350 17 G PO PACK
17.0000 g | PACK | Freq: Every day | ORAL | Status: DC | PRN
  Filled 2020-12-22: qty 1

## 2020-12-22 MED ORDER — SODIUM CHLORIDE 0.9 % IV SOLN
2.0000 g | INTRAVENOUS | Status: DC
  Administered 2020-12-23: 2 g via INTRAVENOUS
  Filled 2020-12-22: qty 2

## 2020-12-22 MED ORDER — SODIUM CHLORIDE 0.9 % IV SOLN
2.0000 g | INTRAVENOUS | Status: DC
  Administered 2020-12-22: 2 g via INTRAVENOUS
  Filled 2020-12-22: qty 20

## 2020-12-22 MED ORDER — IOHEXOL 350 MG/ML SOLN
100.0000 mL | Freq: Once | INTRAVENOUS | Status: AC | PRN
  Administered 2020-12-22: 100 mL via INTRAVENOUS

## 2020-12-22 MED ORDER — SODIUM CHLORIDE 0.9 % IV SOLN
500.0000 mg | INTRAVENOUS | Status: DC
  Administered 2020-12-22: 500 mg via INTRAVENOUS
  Filled 2020-12-22: qty 500

## 2020-12-22 MED ORDER — LACTATED RINGERS IV SOLN: INTRAVENOUS | Status: DC

## 2020-12-22 MED ORDER — SODIUM CHLORIDE 0.9 % IV SOLN: Freq: Once | INTRAVENOUS | Status: AC

## 2020-12-22 MED ORDER — ENOXAPARIN SODIUM 80 MG/0.8ML IJ SOSY
70.0000 mg | PREFILLED_SYRINGE | INTRAMUSCULAR | Status: DC
  Filled 2020-12-22: qty 0.7

## 2020-12-22 MED ORDER — SODIUM CHLORIDE 0.9 % IV SOLN
500.0000 mg | INTRAVENOUS | Status: DC
  Filled 2020-12-22: qty 500

## 2020-12-22 NOTE — Progress Notes (Signed)
Elink monitoring for sepsis protocol 

## 2020-12-22 NOTE — ED Triage Notes (Signed)
The ptis x/o bi-lateral  feet pain for one week no known injury

## 2020-12-22 NOTE — Progress Notes (Signed)
Code sepsis orders inadvertently ordered w/ PCT. Communication w/ provider that sepsis protocol not appropriate for this pt and should be cancelled.

## 2020-12-22 NOTE — Hospital Course (Addendum)
  Chest pain I B/L multifocal airspace disease Lower and mediastinal lymphadenopathy R Intracarinal soft tissue mass Multifocal pneumonia vs metastatic disease, lymphoma, pulmonary malignancy vs autoimmune/inflammatory pulmonary disease (like sarcoidosis)  Patient presenting with chest pain (started left-sided, today on right side as well).  Patient was recently seen in urgent care (on 6/15) diagnosed with possible pneumonia and was prescribed Augmentin.  Compliant with Augmentin with no relief in chest pain.  This morning, started having pain in right chest as well and presented to ED. Has had 1 month of dry cough before starting chest pain on 6/15.  Afebrile, no leukocytosis, RR 22, breathing normally on room air.  Code Sepsis was called in ED but later discontinued. U/A normal. X-ray shows widespread bilateral peribronchial opacity with consolidation in left lung which is unchanged from 4 days ago, no pleural effusion which might indicate bilateral pneumonia but no other signs of infections.  Not likely ACS, troponins were obtained and they are flat at 5>5.  EKG shows normal sinus rhythm, no acute ST-T wave changes. Echocardiogram was normal. sarcoidosis). Patient still lots of mild chest pain with deep breathing.  HbA1c 5.9%.  Culture does not show any growth in 2 days, urine culture does not show any growth.  Patient has family history of sarcoidosis in brother and sister.  Pulmonary scheduled for bronchoscopy on 12/24/2020 and discharged with outpatient follow up.    Marijuana use disorder Patient smokes 7-8 blunts of marijuana daily, since the age of 73.  He uses it to relieve his anxiety and to relax himself.  Denies any other drug use.  UDS is negative for other drugs except marijuana. Patient counseled for marijuana cessation.

## 2020-12-22 NOTE — H&P (Addendum)
Family Medicine Teaching [facility] Admission History and Physical Service Pager: [phone]  Patient name: Darius Joyce Medical record number: [id number] Date of birth: December 12, 1985 Age: 35 y.o. Gender: male  Primary Care Provider: Patient, No Pcp Per (Inactive) Consultants: PCCM Code Status: Full Preferred Emergency Contact: Daine Floras [phone]  Chief Complaint: Chest pain  Assessment and Plan: Darius Joyce is a 35 y.o. male presenting with chest pain . PMH is significant for marijuana use  Chest pain  B/L multifocal airspace disease Hilar and mediastinal lymphadenopathy R infracarinal soft-tissue mass Patient presenting with chest pain (started left-sided, today on right side as well).  Patient was recently seen in urgent care (on 6/15) diagnosed with possible pneumonia and was prescribed Augmentin.  Compliant with Augmentin with no relief in chest pain.  This morning, started having pain in right chest as well and presented to ED. Has had 1 month of dry cough before starting chest pain on 6/15.  Afebrile, no leukocytosis, RR 22, breathing normally on room air.  Code Sepsis was called in ED but later discontinued. U/A normal. X-ray shows widespread bilateral peribronchial opacity with consolidation in left lung which is unchanged from 4 days ago, no pleural effusion which might indicate bilateral pneumonia but no other signs of infections.  Not likely ACS, troponins were obtained and they are flat at 5>5.  EKG shows normal sinus rhythm, no acute ST-T wave changes. CT Angio obtained with the concern of pulmonary embolus.  No evidence of pulmonary embolus but bulky mediastinal and bilateral hilar lymphadenopathy.  Numerous area of masslike consolidation versus soft tissue masses throughout both lungs.  It can be multifocal pneumonia versus metastatic disease, lymphoma, pulmonary malignancy versus autoimmune/inflammatory pulmonary disease (like sarcoidosis).  Calcium not  elevated however therefore unsure of sacroidosis dx. 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 Denies MSM behavior. HIV pending. PCCM consulted in ED and recommended obtaining echocardiogram and follow-up blood culture for possible infectious etiology. --Admit to FPTS, admitting provider Dr. McDiarmid --PCCM consulted, appreciate their assistance --Vitals per floor --f/u Blood culture --f/u ACE, Vit D (for concern of sarcoidosis) --f/u CBC and CMP  --f/u TSH --f/u HbA1c --f/u Procal --f/u Echocardiogram --f/u Urine culture --f/u resp culture --f/u HIV --s/p 1 dose of azithromycin and ceftriaxone --c/w IV azithromycin x 5days (6/19--) --c/w Ceftriaxone x 5days (6/19--)  Marijuana use disorder Patient smokes 7-8 blunts of marijuana daily, since the age of 2.  He uses it to relieve his anxiety and to relax himself.  Denies any other drug use.  UDS is negative for other drugs except marijuana. --Marijuana cessation counseling  FEN/GI: Regular diet Prophylaxis: Lovenox   Disposition: Med-surg  History of Present Illness:  Darius Joyce is a 35 y.o. male presenting with chest pain. He was in his usual state of health until Wednesday (6/15) when he was awoken from sleep with acute onset of chest pain in his left anterior chest and he presented himself to urgent care.  He was informed that he has abnormal x-ray finding and he might have pneumonia.  He was started on Augmentin, he is med compliant but that did not lead to any improvement in his symptoms.  In fact, today he started having pain in his right chest as well and now he has pain in his whole chest which gets worse with deep breathing.  Patient notes to have 1 month of dry cough before chest pain started which is resolved now, no sputum production. 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 Denies recent cough, sick contacts, recent travel,  immobilization, recent surgery, hemoptysis, any family history of DVTs. Has not had contact with birds. Denies MSM sexual activity.   In ED, patient's x-ray was reviewed which showed left-sided infiltrates.  No fever, tachycardia, hypoxia or respiratory distress which is suggestive of another underlying pathological etiology.  CT angiogram rules out pulmonary embolism but reveals multifocal infiltrative lesions in lungs with some lymphadenopathy.  PCCM was consulted and they would like patient to be admitted for echocardiogram, blood culture and they will follow along the patient.   Review Of Systems: Per HPI   Review of Systems  Constitutional: Negative.  Negative for activity change, appetite change, chills, diaphoresis and fatigue.  HENT: Negative.  Negative for hearing loss and sore throat.   Eyes: Negative.  Negative for visual disturbance.  Respiratory:  Positive for cough and shortness of breath.   Cardiovascular:  Positive for chest pain. Negative for palpitations and leg swelling.  Gastrointestinal:  Negative for abdominal pain, diarrhea, nausea and vomiting.  Endocrine: Negative.   Genitourinary: Negative.   Musculoskeletal: Negative.        No extremity pain  Allergic/Immunologic: Negative.   Neurological: Negative.  Negative for dizziness and headaches.  Psychiatric/Behavioral: Negative.      Patient Active Problem List   Diagnosis Date Noted   Dyspnea 12/22/2020     Past Medical History: History reviewed. No pertinent past medical history.  Past Surgical History: History reviewed. No pertinent surgical history.  Social History: Social History   Tobacco Use   Smoking status: Former    Pack years: 0.00   Smokeless tobacco: Never  Vaping Use   Vaping Use: Never used  Substance Use Topics   Alcohol use: Yes    Comment: rarely   Drug use: Yes    Types: Marijuana    Comment: smokes daily   Additional social history: Patient lives with brother and friend. 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  Denies IV drug use and other illicit drugs besides marijuana  Please also refer to relevant sections of EMR.  Family History: Family History  Problem Relation Age of Onset   Hypertension Mother    Hypertension Father    No h/o cancer or autoimmune disease in family  Allergies and Medications: No Known Allergies No current facility-administered medications on file prior to encounter.   Current Outpatient Medications on File Prior to Encounter  Medication Sig Dispense Refill   amoxicillin-clavulanate (AUGMENTIN) 875-125 MG tablet Take 1 tablet by mouth every 12 (twelve) hours for 5 days. 10 tablet 0   benzonatate (TESSALON) 100 MG capsule Take 1 capsule (100 mg total) by mouth every 8 (eight) hours. 21 capsule 0   cetirizine (ZYRTEC ALLERGY) 10 MG tablet Take 1 tablet (10 mg total) by mouth daily. 30 tablet 0   ondansetron (ZOFRAN ODT) 4 MG disintegrating tablet Take 1 tablet (4 mg total) by mouth every 8 (eight) hours as needed for nausea or vomiting. 21 tablet 0   oxymetazoline (AFRIN NASAL SPRAY) 0.05 % nasal spray Place 1 spray into both nostrils 2 (two) times daily. 30 mL 0    Objective: BP (!) 165/117   Pulse 87   Temp 98.5 F (36.9 C) (Oral)   Resp 15   Ht 5\' 10"  (1.778 m)   Wt (!) 311 lb 15.2 oz (141.5 kg)   SpO2 93%  BMI 44.76 kg/m  Exam: General: Well developed obeses male lying comfortably in bed, NAD HEENT: Agency/AT, PERRL Cardiovascular: Regular rate and rhythm, no m/r/g Respiratory: Mildly diminished bibasilar breath sounds, otherwise normal, no wheezing, no crackles, no rales Gastrointestinal: Soft, non-distended, non-distended, BS+ MSK: Able to move all extremities well, no appreciable LE edema  Derm: Warm and dry Neuro: Alert, awake, oriented x 4, no focal neurological deficits Psych: Good mood and affect  Labs and Imaging: CBC BMET  Recent Labs  Lab 12/22/20 0631  WBC 6.0  HGB 14.9  HCT 44.5  PLT 329   Recent Labs   Lab 12/22/20 1253  NA 132*  K 3.8  CL 100  CO2 25  BUN 9  CREATININE 0.86  GLUCOSE 99  CALCIUM 8.6*     EKG: EKG shows normal sinus rhythm, no acute ST-T wave changes. DG Chest 2 View  Result Date: 12/22/2020 CLINICAL DATA:  35 year old male with recent shortness of breath and chest pain. Abnormal lungs on 12/18/2020. EXAM: CHEST - 2 VIEW COMPARISON:  12/18/2020. FINDINGS: Stable mildly low lung volumes. Normal cardiac size and mediastinal contours. Visualized tracheal air column is within normal limits. Extensive multifocal left lung airspace consolidation, mostly perihilar. Patchy and less dense right lung multifocal peribronchial opacity. These appear unchanged from 4 days ago. No pneumothorax, pulmonary edema or pleural effusion. No acute osseous abnormality identified. Negative visible bowel gas pattern. IMPRESSION: Widespread bilateral peribronchial opacity with consolidation in the left lung is unchanged from 4 days ago. No pleural effusion. This continues to resemble Bilateral Pneumonia, but if there are no signs/symptoms of infection then recommend follow-up Chest CT (IV contrast preferred) to further characterize. Electronically Signed   By: Odessa Fleming M.D.   On: 12/22/2020 07:29   CT Angio Chest PE W and/or Wo Contrast  Result Date: 12/22/2020 CLINICAL DATA:  Suspected pulmonary embolus. Recent diagnosis of pneumonia. EXAM: CT ANGIOGRAPHY CHEST WITH CONTRAST TECHNIQUE: Multidetector CT imaging of the chest was performed using the standard protocol during bolus administration of intravenous contrast. Multiplanar CT image reconstructions and MIPs were obtained to evaluate the vascular anatomy. CONTRAST:  OMNIPAQUE IOHEXOL 350 MG/ML SOLN COMPARISON:  Chest radiograph December 22, 2020 FINDINGS: Cardiovascular: Satisfactory opacification of the pulmonary arteries to the segmental level. No evidence of pulmonary embolism. Normal heart size. No pericardial effusion. Mediastinum/Nodes: Bulky  mediastinal and bilateral hilar lymphadenopathy. 5.6 x 2.5 cm soft tissue mass containing scattered calcifications versus conglomeration of abnormal lymph nodes in the right infra carinal region in the posterior mediastinum. Normal trachea, esophagus and thyroid gland. Lungs/Pleura: Numerous areas of masslike consolidation versus soft tissue masses throughout both lungs. Large area of confluent consolidation in the left upper lobe. No evidence of pleural effusion or pneumothorax. Upper Abdomen: No acute abnormality. Musculoskeletal: No chest wall abnormality. No acute or significant osseous findings. Review of the MIP images confirms the above findings. IMPRESSION: 1. No evidence of pulmonary embolus. 2. Bulky mediastinal and bilateral hilar lymphadenopathy. 3. Numerous areas of masslike consolidation versus soft tissue masses throughout both lungs. Findings may represent multifocal pneumonia, however metastatic disease, lymphoma,pulmonary malignancy or autoimmune/inflammatory pulmonary disease are in the differential diagnosis. If patient's clinical presentation does not support multifocal pneumonia, then pulmonology consultation is recommended. These results were called by telephone at the time of interpretation on 12/22/2020 at 12:33 pm to provider [facility] , who verbally acknowledged these results. Electronically Signed   By: Ted Mcalpine M.D.   On: 12/22/2020 12:56  Arnoldo Lenis, MD 12/22/2020, 4:22 PM PGY-1, [facility] Family Medicine FPTS Intern pager: [phone], text pages welcome    FPTS Upper-Level Resident Addendum   I have independently interviewed and examined the patient. I have discussed the above with the original author and agree with their documentation. My edits for correction/addition/clarification are within the document. Please see also any attending notes.   Katha Cabal, DO PGY-2, [facility] 12/22/2020 4:34 PM  FPTS Service pager: [phone] (text  pages welcome through AMION)

## 2020-12-22 NOTE — Consult Note (Addendum)
NAME:  Darius Joyce, MRN:  [id number], DOB:  06/27/1986, LOS: 0 ADMISSION DATE:  12/22/2020, CONSULTATION DATE:  12/22/20 REFERRING MD:  Hyacinth Meeker - EM , CHIEF COMPLAINT:  Chest pain, SOB   History of Present Illness:  35 yo M without a significant medical history except for frequent marijuana use (smoking daily x 84yrs) presents to [facility] 6/19 with CC chest pain and SOB. Pt presented to [facility] 6/15 because of chest pain, which at the time was L sided and described coming and going, stabbing or punching quality. He notes that he has had a dry cough x 1 month preceding onset of chest pain, on occasion he produces dark brown sputum. 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 On 6/19 he presented to ED with similar sx, however location now R sided. CXR obtained in ED which is unchanged to 6/16 imaging. A CTA chest was obtained which reveals bulky mediastinal and hilar lymphadenopathy, a 5.6cm soft tissue mass, scattered bilateral consolidations (v masses).   He works as a Network engineer for [employer] -- exposures to dust, Barrister's clerk, no chemical exposures no hx Holiday representative work. In childhood/adolescence lived on farms with chickens, pigs, as well as pet cats, dogs, parakeet. In adulthood, has dogs only. No known mold exposure. No known allergies including no seasonal allergies.  No IVDU. Monogamous sexual partner, unprotected. No known family hx of autoimmune/rheum dz, heme disorders, malignancies.   ED workup:  BMP and CBC are WNL. Trop-I is 5 x 2 checks. His ECG is normal.  CXR multifocal bilateral ASD CTA no PE, scattered consolidations, 5.6cm soft tissue mass, lymphadenopathy    PCCM consulted for pulmonary evaluation and recommendations in this setting   Pertinent  Medical History  THC use   Significant Hospital Events: Including procedures, antibiotic start and stop dates in addition  to other pertinent events   6/15 started augmentin after UC visit for L sided chest pain (ECG normal, CXR with L>R multifocal ASD) 6/18 ED with chest pain, SOB. ECG normal, CXR unchanged, CTA with soft tissue mass, atypical appearing bilateral consolidations. PCCM consulted for pulm recs.   Interim History / Subjective:  In ED, asking if he is allowed to eat or drink   I showed him and discussed his CXRs and CTA chest so he could see and understand what we are working up.    Objective   Blood pressure (!) 157/104, pulse 80, temperature 98.5 F (36.9 C), temperature source Oral, resp. rate (!) 22, height 5\' 10"  (1.778 m), weight (!) 141.5 kg, SpO2 96 %.        Intake/Output Summary (Last 24 hours) at 12/22/2020 1257 Last data filed at 12/22/2020 0815 Gross per 24 hour  Intake 0 ml  Output 0 ml  Net 0 ml   Filed Weights   12/22/20 0625 12/22/20 0645  Weight: (!) 141.5 kg (!) 141.5 kg    Examination: General: WD obese adult M reclined in ED bed NAD  HENT: NCAT pink mm anicteric sclera  Lungs: Diminished bibasilar sounds. Symmetrical chest expansion, even and unlabored on RA  Cardiovascular: s1s2, I do not appreciate any murmur. Cap refill brisk.  Abdomen: Obese, soft ndnt  Extremities: No acute joint deformity. No clubbing  Neuro: AAOx3 no focal deficit  Skin: Small dark brown raised bumps across cheeks. Scattered healed tattoos  GU: defer   Labs/imaging that I havepersonally reviewed  (right  click and "Reselect all SmartList Selections" daily)  6/15 ECG- normal.  6/15 CXR - bilateral multifocal ASD L>R  6/19 CXR- bilateral multifocal ASD 6/19 CTA chest- no PE, mediastinal and hilar adenopathy 5.6x2.5 soft tissue mass w/ calcifications in R infracarinal territory. Numerous scattered consolidations in both R and L lungs.   Resolved Hospital Problem list     Assessment & Plan:   Bilateral multifocal airspace disease, atypical pattern  Hilar and mediastinal  lymphadenopathy R infracarinal soft tissue mass -think his chest pain is related to the above findings -unusual CT. Seems likely more going on than PNA. No leukocytosis, afebrile, minimally productive cough-- I would almost expect more s/sx if imaging reflected just multifocal PNA. He does not have a social or medical hx that puts him particularly at risk for septic emboli nor is pattern typical for that, hard to exclude though. Can't exclude possibility of malignancy (soft tissue mass + lymphadenopathy) but also possible some of the lymphadenopathy is reactive. Possible sarcoid?  P -Recommend admission to the hospital to work up further since failed OP abx, med surg.  -Bcx, possible TTE?  -HIV  -rocephin, azithro  -sending ACE, Vit D25 (for possible sarcoid) -add on a diff to CBC  -RVP, expectorated sputum if pt can produce, - Check a PCT (but auto-trigger code sepsis can be cancelled) -might end up needing a bronch while he is here   [employer] (right click and "Reselect all SmartList Selections" daily)   Diet/type: Regular consistency (see orders) Pain/Anxiety/Delirium protocol Not indicated VAP protocol (if indicated): Not indicated DVT prophylaxis: other -- per primary GI prophylaxis: N/A Glucose control:  not indicated Central venous access:  N/A Arterial line:  N/A Foley:  N/A Mobility:  OOB  PT consulted: N/A Culture data pending:sputum, urine , and blood Last reviewed culture data:today Antibiotics: azithromycin, rocephin Antibiotic de-escalation: no,  continue current rx Stop date: to be determined  Code Status:  full code Last date of multidisciplinary goals of care discussion [per primary] Disposition: admit to medsurg    Labs   CBC: Recent Labs  Lab 12/22/20 0631  WBC 6.0  HGB 14.9  HCT 44.5  MCV 85.7  PLT 329    Basic Metabolic Panel: Recent Labs  Lab 12/22/20 0631  NA 139  K 4.1  CL 104  CO2 27  GLUCOSE 119*  BUN 10  CREATININE 0.98   CALCIUM 9.2   GFR: Estimated Creatinine Clearance: 149.4 mL/min (by C-G formula based on SCr of 0.98 mg/dL). Recent Labs  Lab 12/22/20 0631  WBC 6.0    Liver Function Tests: No results for input(s): AST, ALT, ALKPHOS, BILITOT, PROT, ALBUMIN in the last 168 hours. No results for input(s): LIPASE, AMYLASE in the last 168 hours. No results for input(s): AMMONIA in the last 168 hours.  ABG No results found for: PHART, PCO2ART, PO2ART, HCO3, TCO2, ACIDBASEDEF, O2SAT   Coagulation Profile: No results for input(s): INR, PROTIME in the last 168 hours.  Cardiac Enzymes: No results for input(s): CKTOTAL, CKMB, CKMBINDEX, TROPONINI in the last 168 hours.  HbA1C: No results found for: HGBA1C  CBG: No results for input(s): GLUCAP in the last 168 hours.  Review of Systems:   Review of Systems  Constitutional: Negative.   HENT: Negative.    Eyes: Negative.   Respiratory:  Positive for cough and sputum production. Negative for hemoptysis and wheezing.   Cardiovascular:  Positive for chest pain. Negative for palpitations, orthopnea and leg swelling.  Gastrointestinal: Negative.  Genitourinary: Negative.   Musculoskeletal: Negative.   Skin: Negative.   Neurological: Negative.   Endo/Heme/Allergies: Negative.   Psychiatric/Behavioral: Negative.      Past Medical History:  He,  has no past medical history on file.   Surgical History:  History reviewed. No pertinent surgical history.   Social History:   reports that he has quit smoking. He has never used smokeless tobacco. He reports current alcohol use. He reports current drug use. Drug: Marijuana.   Family History:  His family history includes Hypertension in his father and mother.   Allergies No Known Allergies   Home Medications  Prior to Admission medications   Medication Sig Start Date End Date Taking? Authorizing Provider  amoxicillin-clavulanate (AUGMENTIN) 875-125 MG tablet Take 1 tablet by mouth every 12  (twelve) hours for 5 days. 12/18/20 12/23/20  Ivette Loyal, NP  benzonatate (TESSALON) 100 MG capsule Take 1 capsule (100 mg total) by mouth every 8 (eight) hours. 02/19/20   Merrilee Jansky, MD  cetirizine (ZYRTEC ALLERGY) 10 MG tablet Take 1 tablet (10 mg total) by mouth daily. 02/19/20   Merrilee Jansky, MD  ondansetron (ZOFRAN ODT) 4 MG disintegrating tablet Take 1 tablet (4 mg total) by mouth every 8 (eight) hours as needed for nausea or vomiting. 02/19/20   Lamptey, Britta Mccreedy, MD  oxymetazoline (AFRIN NASAL SPRAY) 0.05 % nasal spray Place 1 spray into both nostrils 2 (two) times daily. 02/19/20   Lamptey, Britta Mccreedy, MD     Critical care time: n/a    Tessie Fass MSN, AGACNP-BC [facility] Pulmonary/Critical Care Medicine Amion for pager  12/22/2020, 3:23 PM

## 2020-12-22 NOTE — ED Triage Notes (Signed)
[facility] diagnosed with pneumonia on Wednesday  at [facility]   he has had increasing pain sine then  still on the antibiotic just ordered

## 2020-12-22 NOTE — ED Notes (Signed)
This RN attempted to call report to 6N, was given receiving RN'[s contact to call back in 15 min.

## 2020-12-22 NOTE — ED Provider Notes (Signed)
[facility] EMERGENCY DEPARTMENT Provider Note   CSN: [id number] Arrival date & time: 12/22/20  9381     History Chief Complaint  Patient presents with   Chest Pain   Foot Pain    Darius Joyce is a 35 y.o. male.  This patient is a very pleasant 35 year old male, he has no chronic medical history, he has never been diagnosed with any medical problems and takes no daily medications.  He states that on Wednesday he was awoken from sleep with acute onset of chest pain in his left anterior chest, he went to the urgent care where he was told that he might have pneumonia because of an abnormal x-ray finding.  He was not coughing having shortness of breath or fevers.  He denies any travel trauma immobilization surgery although he does endorse smoking "a lot of marijuana".  The patient denies having any family history of DVTs, just family history of diabetes and hypertension.  He works second shift as a Merchandiser, retail at [employer], he is on his feet the majority of his day, he has not had any symptoms in his legs.  He notes that after starting the Augmentin he has had absolutely no improvement in his symptoms, in fact today he started having pain in the right side of his chest in addition to the left side.  This does get worse with deep breathing, he has no hemoptysis   Chest Pain Foot Pain Associated symptoms include chest pain.      History reviewed. No pertinent past medical history.  There are no problems to display for this patient.   History reviewed. No pertinent surgical history.     Family History  Problem Relation Age of Onset   Hypertension Mother    Hypertension Father     Social History   Tobacco Use   Smoking status: Former    Pack years: 0.00   Smokeless tobacco: Never  Vaping Use   Vaping Use: Never used  Substance Use Topics   Alcohol use: Yes    Comment: rarely   Drug use: Yes    Types: Marijuana    Comment: smokes daily     Home Medications Prior to Admission medications   Medication Sig Start Date End Date Taking? Authorizing Provider  amoxicillin-clavulanate (AUGMENTIN) 875-125 MG tablet Take 1 tablet by mouth every 12 (twelve) hours for 5 days. 12/18/20 12/23/20  Ivette Loyal, NP  benzonatate (TESSALON) 100 MG capsule Take 1 capsule (100 mg total) by mouth every 8 (eight) hours. 02/19/20   Merrilee Jansky, MD  cetirizine (ZYRTEC ALLERGY) 10 MG tablet Take 1 tablet (10 mg total) by mouth daily. 02/19/20   Merrilee Jansky, MD  ondansetron (ZOFRAN ODT) 4 MG disintegrating tablet Take 1 tablet (4 mg total) by mouth every 8 (eight) hours as needed for nausea or vomiting. 02/19/20   Lamptey, Britta Mccreedy, MD  oxymetazoline (AFRIN NASAL SPRAY) 0.05 % nasal spray Place 1 spray into both nostrils 2 (two) times daily. 02/19/20   Lamptey, Britta Mccreedy, MD    Allergies    Patient has no known allergies.  Review of Systems   Review of Systems  Cardiovascular:  Positive for chest pain.  All other systems reviewed and are negative.  Physical Exam Updated Vital Signs BP (!) 157/104   Pulse 80   Temp 98.5 F (36.9 C) (Oral)   Resp (!) 22   Ht 1.778 m (5\' 10" )   Wt (!) 141.5 kg  SpO2 96%   BMI 44.76 kg/m   Physical Exam Vitals and nursing note reviewed.  Constitutional:      General: He is not in acute distress.    Appearance: He is well-developed.  HENT:     Head: Normocephalic and atraumatic.     Mouth/Throat:     Pharynx: No oropharyngeal exudate.  Eyes:     General: No scleral icterus.       Right eye: No discharge.        Left eye: No discharge.     Conjunctiva/sclera: Conjunctivae normal.     Pupils: Pupils are equal, round, and reactive to light.  Neck:     Thyroid: No thyromegaly.     Vascular: No JVD.  Cardiovascular:     Rate and Rhythm: Normal rate and regular rhythm.     Heart sounds: Normal heart sounds. No murmur heard.   No friction rub. No gallop.  Pulmonary:     Effort: Pulmonary  effort is normal. No respiratory distress.     Breath sounds: Normal breath sounds. No wheezing or rales.  Abdominal:     General: Bowel sounds are normal. There is no distension.     Palpations: Abdomen is soft. There is no mass.     Tenderness: There is no abdominal tenderness.  Musculoskeletal:        General: No tenderness. Normal range of motion.     Cervical back: Normal range of motion and neck supple.     Right lower leg: No edema.     Left lower leg: No edema.  Lymphadenopathy:     Cervical: No cervical adenopathy.  Skin:    General: Skin is warm and dry.     Findings: No erythema or rash.  Neurological:     Mental Status: He is alert.     Coordination: Coordination normal.  Psychiatric:        Behavior: Behavior normal.    ED Results / Procedures / Treatments   Labs (all labs ordered are listed, but only abnormal results are displayed) Labs Reviewed  BASIC METABOLIC PANEL - Abnormal; Notable for the following components:      Result Value   Glucose, Bld 119 (*)    All other components within normal limits  RESP PANEL BY RT-PCR (FLU A&B, COVID) ARPGX2  CULTURE, BLOOD (ROUTINE X 2)  CULTURE, BLOOD (ROUTINE X 2)  URINE CULTURE  RESPIRATORY PANEL BY PCR  CBC  LACTIC ACID, PLASMA  LACTIC ACID, PLASMA  COMPREHENSIVE METABOLIC PANEL  PROTIME-INR  APTT  URINALYSIS, ROUTINE W REFLEX MICROSCOPIC  HIV ANTIBODY (ROUTINE TESTING W REFLEX)  ANGIOTENSIN CONVERTING ENZYME  VITAMIN D 25 HYDROXY (VIT D DEFICIENCY, FRACTURES)  DIFFERENTIAL  TROPONIN I (HIGH SENSITIVITY)  TROPONIN I (HIGH SENSITIVITY)    EKG None  Radiology DG Chest 2 View  Result Date: 12/22/2020 CLINICAL DATA:  35 year old male with recent shortness of breath and chest pain. Abnormal lungs on 12/18/2020. EXAM: CHEST - 2 VIEW COMPARISON:  12/18/2020. FINDINGS: Stable mildly low lung volumes. Normal cardiac size and mediastinal contours. Visualized tracheal air column is within normal limits. Extensive  multifocal left lung airspace consolidation, mostly perihilar. Patchy and less dense right lung multifocal peribronchial opacity. These appear unchanged from 4 days ago. No pneumothorax, pulmonary edema or pleural effusion. No acute osseous abnormality identified. Negative visible bowel gas pattern. IMPRESSION: Widespread bilateral peribronchial opacity with consolidation in the left lung is unchanged from 4 days ago. No pleural effusion. This continues to  resemble Bilateral Pneumonia, but if there are no signs/symptoms of infection then recommend follow-up Chest CT (IV contrast preferred) to further characterize. Electronically Signed   By: Odessa Fleming M.D.   On: 12/22/2020 07:29   CT Angio Chest PE W and/or Wo Contrast  Result Date: 12/22/2020 CLINICAL DATA:  Suspected pulmonary embolus. Recent diagnosis of pneumonia. EXAM: CT ANGIOGRAPHY CHEST WITH CONTRAST TECHNIQUE: Multidetector CT imaging of the chest was performed using the standard protocol during bolus administration of intravenous contrast. Multiplanar CT image reconstructions and MIPs were obtained to evaluate the vascular anatomy. CONTRAST:  OMNIPAQUE IOHEXOL 350 MG/ML SOLN COMPARISON:  Chest radiograph December 22, 2020 FINDINGS: Cardiovascular: Satisfactory opacification of the pulmonary arteries to the segmental level. No evidence of pulmonary embolism. Normal heart size. No pericardial effusion. Mediastinum/Nodes: Bulky mediastinal and bilateral hilar lymphadenopathy. 5.6 x 2.5 cm soft tissue mass containing scattered calcifications versus conglomeration of abnormal lymph nodes in the right infra carinal region in the posterior mediastinum. Normal trachea, esophagus and thyroid gland. Lungs/Pleura: Numerous areas of masslike consolidation versus soft tissue masses throughout both lungs. Large area of confluent consolidation in the left upper lobe. No evidence of pleural effusion or pneumothorax. Upper Abdomen: No acute abnormality.  Musculoskeletal: No chest wall abnormality. No acute or significant osseous findings. Review of the MIP images confirms the above findings. IMPRESSION: 1. No evidence of pulmonary embolus. 2. Bulky mediastinal and bilateral hilar lymphadenopathy. 3. Numerous areas of masslike consolidation versus soft tissue masses throughout both lungs. Findings may represent multifocal pneumonia, however metastatic disease, lymphoma,pulmonary malignancy or autoimmune/inflammatory pulmonary disease are in the differential diagnosis. If patient's clinical presentation does not support multifocal pneumonia, then pulmonology consultation is recommended. These results were called by telephone at the time of interpretation on 12/22/2020 at 12:33 pm to provider [facility] , who verbally acknowledged these results. Electronically Signed   By: Ted Mcalpine M.D.   On: 12/22/2020 12:56    Procedures Procedures   Medications Ordered in ED Medications  lactated ringers infusion (has no administration in time range)  cefTRIAXone (ROCEPHIN) 2 g in sodium chloride 0.9 % 100 mL IVPB (has no administration in time range)  azithromycin (ZITHROMAX) 500 mg in sodium chloride 0.9 % 250 mL IVPB (has no administration in time range)  iohexol (OMNIPAQUE) 350 MG/ML injection 100 mL (100 mLs Intravenous Contrast Given 12/22/20 1144)    ED Course  I have reviewed the triage vital signs and the nursing notes.  Pertinent labs & imaging results that were available during my care of the patient were reviewed by me and considered in my medical decision making (see chart for details).  Clinical Course as of 12/22/20 1323  Sun Dec 22, 2020  1242 I discussed the care of this patient with radiology who states that there are multiple lesions in the lungs, this is not necessarily consistent with an infectious process or septic emboli but could be related to inflammatory or metastatic lesions.  The patient still appears well is not requiring  oxygen and has no leukocytosis.  I discussed the care with the pulmonary team, physicians will be consulted and return recommendations. [BM]    Clinical Course User Index [BM] Eber Hong, MD   MDM Rules/Calculators/A&P                           I have personally viewed and interpreted the patient's x-ray which shows left-sided infiltrates.  In this scenario with no fever,  no tachycardia, no hypoxia and no respiratory symptoms that is suggestive of another underlying pathologic etiology.  He denies being high risk for HIV, he has no infectious symptoms, we will perform a CT angiogram to further evaluate the anatomy of his lungs including ruling out pulmonary embolism or masses.  Would also consider things such as sarcoid, infiltrative diseases, pneumothorax, masses, atypical infection.  The patient is agreeable to the plan.  The CT scan reveals multifocal infiltrative lesions in the lungs with some lymphadenopathy, I discussed the case with pulmonology, they recommend the patient be admitted for echocardiogram, blood cultures, they will consult.  Discussed with family medicine resident who will admit,  Labs are reassuring  Final Clinical Impression(s) / ED Diagnoses Final diagnoses:  Multifocal pneumonia    Rx / DC Orders ED Discharge Orders     None        Eber HongMiller, Andretta Ergle, MD 12/22/20 1323

## 2020-12-23 ENCOUNTER — Encounter (HOSPITAL_COMMUNITY): Payer: Self-pay | Admitting: Family Medicine

## 2020-12-23 ENCOUNTER — Inpatient Hospital Stay (HOSPITAL_COMMUNITY): Payer: Self-pay

## 2020-12-23 DIAGNOSIS — F1290 Cannabis use, unspecified, uncomplicated: Secondary | ICD-10-CM | POA: Diagnosis present

## 2020-12-23 DIAGNOSIS — Z419 Encounter for procedure for purposes other than remedying health state, unspecified: Secondary | ICD-10-CM

## 2020-12-23 DIAGNOSIS — R590 Localized enlarged lymph nodes: Secondary | ICD-10-CM | POA: Diagnosis present

## 2020-12-23 DIAGNOSIS — R0609 Other forms of dyspnea: Secondary | ICD-10-CM

## 2020-12-23 DIAGNOSIS — E559 Vitamin D deficiency, unspecified: Secondary | ICD-10-CM

## 2020-12-23 DIAGNOSIS — D860 Sarcoidosis of lung: Secondary | ICD-10-CM

## 2020-12-23 DIAGNOSIS — R0781 Pleurodynia: Secondary | ICD-10-CM | POA: Diagnosis present

## 2020-12-23 DIAGNOSIS — J189 Pneumonia, unspecified organism: Secondary | ICD-10-CM

## 2020-12-23 DIAGNOSIS — J181 Lobar pneumonia, unspecified organism: Secondary | ICD-10-CM | POA: Diagnosis present

## 2020-12-23 DIAGNOSIS — R0789 Other chest pain: Secondary | ICD-10-CM

## 2020-12-23 DIAGNOSIS — R918 Other nonspecific abnormal finding of lung field: Secondary | ICD-10-CM

## 2020-12-23 HISTORY — DX: Localized enlarged lymph nodes: R59.0

## 2020-12-23 LAB — COMPREHENSIVE METABOLIC PANEL
ALT: 22 U/L (ref 0–44)
AST: 23 U/L (ref 15–41)
Albumin: 3 g/dL — ABNORMAL LOW (ref 3.5–5.0)
Alkaline Phosphatase: 64 U/L (ref 38–126)
Anion gap: 7 (ref 5–15)
BUN: 7 mg/dL (ref 6–20)
CO2: 27 mmol/L (ref 22–32)
Calcium: 8.8 mg/dL — ABNORMAL LOW (ref 8.9–10.3)
Chloride: 105 mmol/L (ref 98–111)
Creatinine, Ser: 1.2 mg/dL (ref 0.61–1.24)
GFR, Estimated: >60 mL/min (ref >60)
Glucose, Bld: 104 mg/dL — ABNORMAL HIGH (ref 70–99)
Potassium: 3.8 mmol/L (ref 3.5–5.1)
Sodium: 139 mmol/L (ref 135–145)
Total Bilirubin: 0.5 mg/dL (ref 0.3–1.2)
Total Protein: 6.9 g/dL (ref 6.5–8.1)

## 2020-12-23 LAB — CBC
HCT: 39.8 % (ref 39.0–52.0)
Hemoglobin: 13.3 g/dL (ref 13.0–17.0)
MCH: 28.1 pg (ref 26.0–34.0)
MCHC: 33.4 g/dL (ref 30.0–36.0)
MCV: 84 fL (ref 80.0–100.0)
Platelets: 310 10*3/uL (ref 150–400)
RBC: 4.74 MIL/uL (ref 4.22–5.81)
RDW: 13.5 % (ref 11.5–15.5)
WBC: 6.4 10*3/uL (ref 4.0–10.5)
nRBC: 0 % (ref 0.0–0.2)

## 2020-12-23 LAB — ECHOCARDIOGRAM COMPLETE
AR max vel: 3.77 cm2
AV Area VTI: 4.04 cm2
AV Area mean vel: 3.63 cm2
AV Mean grad: 4 mmHg
AV Peak grad: 7.2 mmHg
Ao pk vel: 1.34 m/s
Area-P 1/2: 3.16 cm2
Height: 70 in
S' Lateral: 3.1 cm
Weight: 4991.21 oz

## 2020-12-23 LAB — URINE CULTURE: Culture: NO GROWTH

## 2020-12-23 LAB — TSH: TSH: 1.959 u[IU]/mL (ref 0.350–4.500)

## 2020-12-23 MED ORDER — PERFLUTREN LIPID MICROSPHERE
1.0000 mL | INTRAVENOUS | Status: AC | PRN
  Administered 2020-12-23: 6 mL via INTRAVENOUS
  Filled 2020-12-23: qty 10

## 2020-12-23 MED ORDER — ENOXAPARIN SODIUM 80 MG/0.8ML IJ SOSY
70.0000 mg | PREFILLED_SYRINGE | INTRAMUSCULAR | Status: AC
Start: 2020-12-23 — End: 2020-12-23
  Administered 2020-12-23: 70 mg via SUBCUTANEOUS
  Filled 2020-12-23: qty 0.8

## 2020-12-23 NOTE — Progress Notes (Signed)
NAME:  Darius Joyce, MRN:  [id number], DOB:  1986-05-07, LOS: 0 ADMISSION DATE:  12/22/2020, CONSULTATION DATE:  12/22/20 REFERRING MD:  Sabra Heck - EM , CHIEF COMPLAINT:  Chest pain, SOB   History of Present Illness:  35 yo M without a significant medical history except for frequent marijuana use (smoking daily x 61yr) [facility] and [facility]. [facility] pain, which at the time was L sided and described coming and going, stabbing or punching quality. He notes that he has had a dry cough x 1 month preceding onset of chest pain, on occasion he produces dark brown sputum. 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 On 6/19 he presented to ED with similar sx, however location now R sided. CXR obtained in ED which is unchanged to 6/16 imaging. A CTA chest was obtained which reveals bulky mediastinal and hilar lymphadenopathy, a 5.6cm soft tissue mass, scattered bilateral consolidations (v masses).   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 In childhood/adolescence lived on farms with chickens, pigs, as well as pet cats, dogs, parakeet. In adulthood, has dogs only. No known mold exposure. No known allergies including no seasonal allergies.  No IVDU. Monogamous sexual partner, unprotected. No known family hx of autoimmune/rheum dz, heme disorders, malignancies.   ED workup:  BMP and CBC are WNL. Trop-I is 5 x 2 checks. His ECG is normal.  CXR multifocal bilateral ASD CTA no PE, scattered consolidations, 5.6cm soft tissue mass, lymphadenopathy    PCCM consulted for pulmonary evaluation and recommendations in this setting   Pertinent  Medical History  THC use   Significant Hospital Events: Including procedures, antibiotic start and stop dates in addition  to other pertinent events   6/15 started augmentin after UC visit for L sided chest pain (ECG normal, CXR with L>R multifocal ASD) 6/18 ED with chest pain, SOB. ECG normal, CXR unchanged, CTA with soft tissue mass, atypical appearing bilateral consolidations. PCCM consulted for pulm recs.   Interim History / Subjective:  No overnight issues. Still having chest pain. Resting comfortably laying flat on room air  Objective   Blood pressure 139/84, pulse 82, temperature 99.1 F (37.3 C), temperature source Oral, resp. rate 18, height _0  (1.778 m), weight (!) 141.5 kg, SpO2 99 %.        Intake/Output Summary (Last 24 hours) at 12/23/2020 1042 Last data filed at 12/22/2020 2349 Gross per 24 hour  Intake 598 ml  Output --  Net 598 ml    Filed Weights   12/22/20 0625 12/22/20 0645  Weight: (!) 141.5 kg (!) 141.5 kg    Examination: General: obese man no respiratory distress HENT: NCAT pink mm anicteric sclera  Lungs: diminished, no wheezes or crackles, non labored on room air Cardiovascular: RRR no mrg Abdomen: obese, soft Extremities: no edema, no clubbing Neuro: Aox3 no focal deficits, normal speech Skin: small raised freckles on face  Labs/imaging that I havepersonally reviewed  (right click and "Reselect all SmartList Selections" daily)  6/15 ECG- normal.  6/15 CXR - bilateral multifocal ASD L>R  6/19 CXR- bilateral multifocal ASD 6/19 CTA chest- no PE, mediastinal and hilar adenopathy 5.6x2.5 soft tissue mass w/ calcifications in R infracarinal territory. Numerous scattered consolidations in both R and L  lungs.   Procalcitonin <.010  Resolved Hospital Problem list     Assessment & Plan:   Bilateral multifocal airspace disease, atypical pattern  Hilar and mediastinal lymphadenopathy R infracarinal soft tissue mass - patient has a brother with sarcoidosis - high likelihood this is sarcoidosis. Need to exclude malignancy, atypical infection P - NPO at midnight. Will  plan for bronchoscopy, EBUS, BAL and transbronchial biopsies of the lung mass tomorrow.  - discussed plan with the patient.  - procalcitonin low, probably not bacterial infection, suspect abx can be stopped.   Lenice Llamas, MD Pulmonary and [place]    Labs   CBC: Recent Labs  Lab 12/22/20 0631 12/22/20 1313 12/23/20 0031  WBC 6.0  --  6.4  NEUTROABS  --  5.8  --   HGB 14.9  --  13.3  HCT 44.5  --  39.8  MCV 85.7  --  84.0  PLT 329  --  310     Basic Metabolic Panel: Recent Labs  Lab 12/22/20 0631 12/22/20 1253 12/23/20 0031  NA 139 132* 139  K 4.1 3.8 3.8  CL 104 100 105  CO2 _0 GLUCOSE 119* 99 104*  BUN _1 CREATININE 0.98 0.86 1.20  CALCIUM 9.2 8.6* 8.8*    GFR: Estimated Creatinine Clearance: 122 mL/min (by C-G formula based on SCr of 1.2 mg/dL). Recent Labs  Lab 12/22/20 0631 12/22/20 1253 12/22/20 2054 12/23/20 0031  PROCALCITON  --   --  <0.10  --   WBC 6.0  --   --  6.4  LATICACIDVEN  --  1.1  --   --      Liver Function Tests: Recent Labs  Lab 12/22/20 1253 12/23/20 0031  AST 22 23  ALT 21 22  ALKPHOS 62 64  BILITOT 0.6 0.5  PROT 6.8 6.9  ALBUMIN 3.1* 3.0*   No results for input(s): LIPASE, AMYLASE in the last 168 hours. No results for input(s): AMMONIA in the last 168 hours.  ABG No results found for: PHART, PCO2ART, PO2ART, HCO3, TCO2, ACIDBASEDEF, O2SAT   Coagulation Profile: Recent Labs  Lab 12/22/20 1253  INR 1.0    Cardiac Enzymes: No results for input(s): CKTOTAL, CKMB, CKMBINDEX, TROPONINI in the last 168 hours.  HbA1C: No results found for: HGBA1C  CBG: No results for input(s): GLUCAP in the last 168 hours.

## 2020-12-23 NOTE — Plan of Care (Signed)

## 2020-12-23 NOTE — H&P (View-Only) (Signed)
NAME:  Darius Joyce, MRN:  [id number], DOB:  Dec 21, 1985, LOS: 0 ADMISSION DATE:  12/22/2020, CONSULTATION DATE:  12/22/20 REFERRING MD:  Sabra Heck - EM , CHIEF COMPLAINT:  Chest pain, SOB   History of Present Illness:  35 yo M without a significant medical history except for frequent marijuana use (smoking daily x 32yr) [facility] and [facility]. [facility], which at the time was L sided and described coming and going, stabbing or punching quality. He notes that he has had a dry cough x 1 month preceding onset of chest pain, on occasion he produces dark brown sputum. 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 On 6/19 he presented to ED with similar sx, however location now R sided. CXR obtained in ED which is unchanged to 6/16 imaging. A CTA chest was obtained which reveals bulky mediastinal and hilar lymphadenopathy, a 5.6cm soft tissue mass, scattered bilateral consolidations (v masses).   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 In childhood/adolescence lived on farms with chickens, pigs, as well as pet cats, dogs, parakeet. In adulthood, has dogs only. No known mold exposure. No known allergies including no seasonal allergies.  No IVDU. Monogamous sexual partner, unprotected. No known family hx of autoimmune/rheum dz, heme disorders, malignancies.   ED workup:  BMP and CBC are WNL. Trop-I is 5 x 2 checks. His ECG is normal.  CXR multifocal bilateral ASD CTA no PE, scattered consolidations, 5.6cm soft tissue mass, lymphadenopathy    PCCM consulted for pulmonary evaluation and recommendations in this setting   Pertinent  Medical History  THC use   Significant Hospital Events: Including procedures, antibiotic start and stop dates in addition  to other pertinent events   6/15 started augmentin after UC visit for L sided chest pain (ECG normal, CXR with L>R multifocal ASD) 6/18 ED with chest pain, SOB. ECG normal, CXR unchanged, CTA with soft tissue mass, atypical appearing bilateral consolidations. PCCM consulted for pulm recs.   Interim History / Subjective:  No overnight issues. Still having chest pain. Resting comfortably laying flat on room air  Objective   Blood pressure 139/84, pulse 82, temperature 99.1 F (37.3 C), temperature source Oral, resp. rate 18, height _0  (1.778 m), weight (!) 141.5 kg, SpO2 99 %.        Intake/Output Summary (Last 24 hours) at 12/23/2020 1042 Last data filed at 12/22/2020 2349 Gross per 24 hour  Intake 598 ml  Output --  Net 598 ml    Filed Weights   12/22/20 0625 12/22/20 0645  Weight: (!) 141.5 kg (!) 141.5 kg    Examination: General: obese man no respiratory distress HENT: NCAT pink mm anicteric sclera  Lungs: diminished, no wheezes or crackles, non labored on room air Cardiovascular: RRR no mrg Abdomen: obese, soft Extremities: no edema, no clubbing Neuro: Aox3 no focal deficits, normal speech Skin: small raised freckles on face  Labs/imaging that I havepersonally reviewed  (right click and "Reselect all SmartList Selections" daily)  6/15 ECG- normal.  6/15 CXR - bilateral multifocal ASD L>R  6/19 CXR- bilateral multifocal ASD 6/19 CTA chest- no PE, mediastinal and hilar adenopathy 5.6x2.5 soft tissue mass w/ calcifications in R infracarinal territory. Numerous scattered consolidations in both R and L  lungs.   Procalcitonin <.010  Resolved Hospital Problem list     Assessment & Plan:   Bilateral multifocal airspace disease, atypical pattern  Hilar and mediastinal lymphadenopathy R infracarinal soft tissue mass - patient has a brother with sarcoidosis - high likelihood this is sarcoidosis. Need to exclude malignancy, atypical infection P - NPO at midnight. Will  plan for bronchoscopy, EBUS, BAL and transbronchial biopsies of the lung mass tomorrow.  - discussed plan with the patient.  - procalcitonin low, probably not bacterial infection, suspect abx can be stopped.   Lenice Llamas, MD Pulmonary and [place]    Labs   CBC: Recent Labs  Lab 12/22/20 0631 12/22/20 1313 12/23/20 0031  WBC 6.0  --  6.4  NEUTROABS  --  5.8  --   HGB 14.9  --  13.3  HCT 44.5  --  39.8  MCV 85.7  --  84.0  PLT 329  --  310     Basic Metabolic Panel: Recent Labs  Lab 12/22/20 0631 12/22/20 1253 12/23/20 0031  NA 139 132* 139  K 4.1 3.8 3.8  CL 104 100 105  CO2 _0 GLUCOSE 119* 99 104*  BUN _1 CREATININE 0.98 0.86 1.20  CALCIUM 9.2 8.6* 8.8*    GFR: Estimated Creatinine Clearance: 122 mL/min (by C-G formula based on SCr of 1.2 mg/dL). Recent Labs  Lab 12/22/20 0631 12/22/20 1253 12/22/20 2054 12/23/20 0031  PROCALCITON  --   --  <0.10  --   WBC 6.0  --   --  6.4  LATICACIDVEN  --  1.1  --   --      Liver Function Tests: Recent Labs  Lab 12/22/20 1253 12/23/20 0031  AST 22 23  ALT 21 22  ALKPHOS 62 64  BILITOT 0.6 0.5  PROT 6.8 6.9  ALBUMIN 3.1* 3.0*   No results for input(s): LIPASE, AMYLASE in the last 168 hours. No results for input(s): AMMONIA in the last 168 hours.  ABG No results found for: PHART, PCO2ART, PO2ART, HCO3, TCO2, ACIDBASEDEF, O2SAT   Coagulation Profile: Recent Labs  Lab 12/22/20 1253  INR 1.0    Cardiac Enzymes: No results for input(s): CKTOTAL, CKMB, CKMBINDEX, TROPONINI in the last 168 hours.  HbA1C: No results found for: HGBA1C  CBG: No results for input(s): GLUCAP in the last 168 hours.

## 2020-12-23 NOTE — Progress Notes (Signed)
Family Medicine Teaching Service Daily Progress Note Intern Pager: [phone]  Patient name: Darius Joyce Medical record number: [id number] Date of birth: 1985/12/22 Age: 35 y.o. Gender: male  Primary Care Provider: Patient, No Pcp Per (Inactive) Consultants: PCCM Code Status: Full  Pt Overview and Major Events to Date:  6/202/2022: Admitted to FPTS  Assessment and Plan: Darius Joyce is a 35 year old male presenting with chest pain.  PMH is significant for marijuana use  Chest pain I B/L multifocal airspace disease Lower and mediastinal lymphadenopathy R Intracarinal soft tissue mass Multifocal pneumonia vs metastatic disease, lymphoma, pulmonary malignancy vs autoimmune/inflammatory pulmonary disease (like sarcoidosis) Patient notes his chest pain improved this morning but still present with deep breathing. TSH normal 1.959,  ESR 45 mildly elevated, CRP mildly elevated 4.3, procal normal, respiratory panel normal, blood culture does not show any growth, Vit D 13.67, low value, HIV non-reactive.Pulmonary is following patient and plans to do bronchoscopy,EBUS, BAL and transbronchial biopsies of lung mass tomorrow. As per Pulmonary note patient's brother has h/o sarcoidosis. --PCCM following, appreciate their assistance --Bronchoscopy tomorrow --N.p.o. at midnight --f/u Ace --f/u CBC and CMP --f/u HbA1c --f/u Urine and Blood culture --d/c IV azithromycin  --d/c Ceftriaxone  --Hold Lovenox tomorrow     FEN/GI: Regular diet PPx: Lovenox   Status is: Observation  The patient will require care spanning > 2 midnights and should be moved to inpatient because: Ongoing diagnostic testing needed not appropriate for outpatient work up  Dispo: The patient is from: Home              Anticipated d/c is to: Home              Patient currently is not medically stable to d/c.   Difficult to place patient No        Subjective:  No Acute overnight events. Patient evaluated at  bedside and only complains of mild chest pain with deep breathing.  Objective: Temp:  [98.6 F (37 C)-99.1 F (37.3 C)] 99.1 F (37.3 C) (06/20 0618) Pulse Rate:  [78-96] 82 (06/20 0618) Resp:  [13-29] 18 (06/20 0254) BP: (139-186)/(79-117) 139/84 (06/20 0618) SpO2:  [93 %-100 %] 95 % (06/20 1057) Physical Exam: General: Pleasant young obese male lying comfortably in bed, NAD Cardiovascular: RRR, no m/r/g Respiratory: Decreased bibasilar lung sounds  Abdomen: Soft, non-tender, non-distended, BS + Extremities: No edema appreciated  Laboratory: Recent Labs  Lab 12/22/20 0631 12/23/20 0031  WBC 6.0 6.4  HGB 14.9 13.3  HCT 44.5 39.8  PLT 329 310   Recent Labs  Lab 12/22/20 0631 12/22/20 1253 12/23/20 0031  NA 139 132* 139  K 4.1 3.8 3.8  CL 104 100 105  CO2 _0 BUN _1 CREATININE 0.98 0.86 1.20  CALCIUM 9.2 8.6* 8.8*  PROT  --  6.8 6.9  BILITOT  --  0.6 0.5  ALKPHOS  --  62 64  ALT  --  21 22  AST  --  22 23  GLUCOSE 119* 99 104*    Imaging/Diagnostic Tests: No results found.   Honor Junes, MD 12/23/2020, 12:48 PM PGY-1, [facility] Intern pager: [phone], text pages welcome

## 2020-12-24 ENCOUNTER — Telehealth: Payer: Self-pay | Admitting: Internal Medicine

## 2020-12-24 ENCOUNTER — Inpatient Hospital Stay (HOSPITAL_COMMUNITY): Payer: Self-pay

## 2020-12-24 ENCOUNTER — Inpatient Hospital Stay (HOSPITAL_COMMUNITY): Payer: Self-pay | Admitting: Anesthesiology

## 2020-12-24 ENCOUNTER — Other Ambulatory Visit (HOSPITAL_COMMUNITY): Payer: Self-pay

## 2020-12-24 ENCOUNTER — Encounter (HOSPITAL_COMMUNITY): Payer: Self-pay | Admitting: Family Medicine

## 2020-12-24 ENCOUNTER — Encounter (HOSPITAL_COMMUNITY)
Admission: EM | Disposition: A | Discharge status: Home / Self Care | Payer: Self-pay | Source: Home / Self Care | Attending: Family Medicine

## 2020-12-24 DIAGNOSIS — Z419 Encounter for procedure for purposes other than remedying health state, unspecified: Secondary | ICD-10-CM

## 2020-12-24 DIAGNOSIS — E559 Vitamin D deficiency, unspecified: Secondary | ICD-10-CM

## 2020-12-24 DIAGNOSIS — I10 Essential (primary) hypertension: Secondary | ICD-10-CM

## 2020-12-24 HISTORY — PX: BRONCHIAL WASHINGS: SHX5105

## 2020-12-24 HISTORY — PX: BRONCHIAL NEEDLE ASPIRATION BIOPSY: SHX5106

## 2020-12-24 HISTORY — PX: ENDOBRONCHIAL ULTRASOUND: SHX5096

## 2020-12-24 HISTORY — PX: VIDEO BRONCHOSCOPY: SHX5072

## 2020-12-24 HISTORY — PX: BRONCHIAL BIOPSY: SHX5109

## 2020-12-24 HISTORY — PX: HEMOSTASIS CONTROL: SHX6838

## 2020-12-24 LAB — BODY FLUID CELL COUNT WITH DIFFERENTIAL
Eos, Fluid: 5 %
Lymphs, Fluid: 14 %
Monocyte-Macrophage-Serous Fluid: 13 % — ABNORMAL LOW (ref 50–90)
Neutrophil Count, Fluid: 68 % — ABNORMAL HIGH (ref 0–25)
Total Nucleated Cell Count, Fluid: 107 cu mm (ref 0–1000)

## 2020-12-24 LAB — BASIC METABOLIC PANEL
Anion gap: 9 (ref 5–15)
BUN: 8 mg/dL (ref 6–20)
CO2: 27 mmol/L (ref 22–32)
Calcium: 9.2 mg/dL (ref 8.9–10.3)
Chloride: 102 mmol/L (ref 98–111)
Creatinine, Ser: 0.93 mg/dL (ref 0.61–1.24)
GFR, Estimated: >60 mL/min (ref >60)
Glucose, Bld: 94 mg/dL (ref 70–99)
Potassium: 4.2 mmol/L (ref 3.5–5.1)
Sodium: 138 mmol/L (ref 135–145)

## 2020-12-24 LAB — CBC
HCT: 39.7 % (ref 39.0–52.0)
Hemoglobin: 13.3 g/dL (ref 13.0–17.0)
MCH: 28.1 pg (ref 26.0–34.0)
MCHC: 33.5 g/dL (ref 30.0–36.0)
MCV: 83.9 fL (ref 80.0–100.0)
Platelets: 306 10*3/uL (ref 150–400)
RBC: 4.73 MIL/uL (ref 4.22–5.81)
RDW: 13.7 % (ref 11.5–15.5)
WBC: 6 10*3/uL (ref 4.0–10.5)
nRBC: 0 % (ref 0.0–0.2)

## 2020-12-24 LAB — HEMOGLOBIN A1C
Hgb A1c MFr Bld: 5.9 % — ABNORMAL HIGH (ref 4.8–5.6)
Mean Plasma Glucose: 123 mg/dL

## 2020-12-24 LAB — ANGIOTENSIN CONVERTING ENZYME: Angiotensin-Converting Enzyme: 98 U/L — ABNORMAL HIGH (ref 14–82)

## 2020-12-24 SURGERY — BRONCHOSCOPY, WITH FLUOROSCOPY
Anesthesia: General

## 2020-12-24 MED ORDER — DEXMEDETOMIDINE (PRECEDEX) IN NS 20 MCG/5ML (4 MCG/ML) IV SYRINGE
PREFILLED_SYRINGE | INTRAVENOUS | Status: DC | PRN
  Administered 2020-12-24: 8 ug via INTRAVENOUS
  Administered 2020-12-24 (×2): 12 ug via INTRAVENOUS

## 2020-12-24 MED ORDER — LACTATED RINGERS IV SOLN: INTRAVENOUS | Status: DC

## 2020-12-24 MED ORDER — CHLORHEXIDINE GLUCONATE 0.12 % MT SOLN
OROMUCOSAL | Status: AC
  Administered 2020-12-24: 15 mL
  Filled 2020-12-24: qty 15

## 2020-12-24 MED ORDER — PROPOFOL 10 MG/ML IV BOLUS
INTRAVENOUS | Status: DC | PRN
  Administered 2020-12-24: 200 mg via INTRAVENOUS

## 2020-12-24 MED ORDER — OXYCODONE HCL 5 MG PO TABS: 5.0000 mg | ORAL_TABLET | Freq: Once | ORAL | Status: DC | PRN

## 2020-12-24 MED ORDER — AMLODIPINE BESYLATE 5 MG PO TABS
5.0000 mg | ORAL_TABLET | Freq: Every day | ORAL | 0 refills | Status: DC
  Filled 2020-12-24: qty 30, 30d supply, fill #0

## 2020-12-24 MED ORDER — OXYCODONE HCL 5 MG/5ML PO SOLN: 5.0000 mg | Freq: Once | ORAL | Status: DC | PRN

## 2020-12-24 MED ORDER — ONDANSETRON HCL 4 MG/2ML IJ SOLN
INTRAMUSCULAR | Status: DC | PRN
  Administered 2020-12-24: 4 mg via INTRAVENOUS

## 2020-12-24 MED ORDER — ACETAMINOPHEN 160 MG/5ML PO SOLN: 325.0000 mg | ORAL | Status: DC | PRN

## 2020-12-24 MED ORDER — MIDAZOLAM HCL 2 MG/2ML IJ SOLN
INTRAMUSCULAR | Status: DC | PRN
  Administered 2020-12-24: 2 mg via INTRAVENOUS

## 2020-12-24 MED ORDER — CHOLECALCIFEROL 10 MCG (400 UNIT) PO TABS
1000.0000 [IU] | ORAL_TABLET | Freq: Every day | ORAL | Status: DC
  Administered 2020-12-24: 1000 [IU] via ORAL
  Filled 2020-12-24: qty 3

## 2020-12-24 MED ORDER — ONDANSETRON HCL 4 MG/2ML IJ SOLN
4.0000 mg | Freq: Once | INTRAMUSCULAR | Status: DC | PRN
Start: 2020-12-24 — End: 2020-12-24

## 2020-12-24 MED ORDER — LIDOCAINE 2% (20 MG/ML) 5 ML SYRINGE
INTRAMUSCULAR | Status: DC | PRN
  Administered 2020-12-24: 100 mg via INTRAVENOUS

## 2020-12-24 MED ORDER — DEXAMETHASONE SODIUM PHOSPHATE 10 MG/ML IJ SOLN
INTRAMUSCULAR | Status: DC | PRN
  Administered 2020-12-24: 10 mg via INTRAVENOUS

## 2020-12-24 MED ORDER — SUGAMMADEX SODIUM 200 MG/2ML IV SOLN
INTRAVENOUS | Status: DC | PRN
  Administered 2020-12-24: 400 mg via INTRAVENOUS

## 2020-12-24 MED ORDER — MEPERIDINE HCL 100 MG/ML IJ SOLN: 6.2500 mg | INTRAMUSCULAR | Status: DC | PRN

## 2020-12-24 MED ORDER — FENTANYL CITRATE (PF) 100 MCG/2ML IJ SOLN: 25.0000 ug | INTRAMUSCULAR | Status: DC | PRN

## 2020-12-24 MED ORDER — LABETALOL HCL 5 MG/ML IV SOLN
INTRAVENOUS | Status: DC | PRN
  Administered 2020-12-24: 10 mg via INTRAVENOUS

## 2020-12-24 MED ORDER — FENTANYL CITRATE (PF) 250 MCG/5ML IJ SOLN
INTRAMUSCULAR | Status: DC | PRN
  Administered 2020-12-24: 100 ug via INTRAVENOUS

## 2020-12-24 MED ORDER — VITAMIN D3 25 MCG PO TABS
1000.0000 [IU] | ORAL_TABLET | Freq: Every day | ORAL | 0 refills | Status: AC
  Filled 2020-12-24: qty 30, 30d supply, fill #0

## 2020-12-24 MED ORDER — ACETAMINOPHEN 325 MG PO TABS: 325.0000 mg | ORAL_TABLET | ORAL | Status: DC | PRN

## 2020-12-24 MED ORDER — AMLODIPINE BESYLATE 5 MG PO TABS
5.0000 mg | ORAL_TABLET | Freq: Every day | ORAL | Status: DC
  Administered 2020-12-24: 5 mg via ORAL
  Filled 2020-12-24: qty 1

## 2020-12-24 MED ORDER — ROCURONIUM BROMIDE 10 MG/ML (PF) SYRINGE
PREFILLED_SYRINGE | INTRAVENOUS | Status: DC | PRN
  Administered 2020-12-24 (×2): 30 mg via INTRAVENOUS
  Administered 2020-12-24: 70 mg via INTRAVENOUS

## 2020-12-24 NOTE — Transfer of Care (Signed)
Immediate Anesthesia Transfer of Care Note  Patient: Darius Joyce  Procedure(s) Performed: VIDEO BRONCHOSCOPY WITH FLUORO ENDOBRONCHIAL ULTRASOUND BRONCHIAL WASHINGS BRONCHIAL NEEDLE ASPIRATION BIOPSIES BRONCHIAL BIOPSIES HEMOSTASIS CONTROL  Patient Location: PACU  Anesthesia Type:General  Level of Consciousness: awake, alert  and oriented  Airway & Oxygen Therapy: Patient Spontanous Breathing and Patient connected to nasal cannula oxygen  Post-op Assessment: Report given to RN, Post -op Vital signs reviewed and stable and Patient moving all extremities X 4  Post vital signs: Reviewed and stable  Last Vitals:  Vitals Value Taken Time  BP 152/87 12/24/20 0957  Temp    Pulse 82 12/24/20 1000  Resp 16 12/24/20 1000  SpO2 98 % 12/24/20 1000  Vitals shown include unvalidated device data.  Last Pain:  Vitals:   12/24/20 0752  TempSrc: Oral  PainSc:       Patients Stated Pain Goal: 2 (12/22/20 2227)  Complications: No notable events documented.

## 2020-12-24 NOTE — Anesthesia Procedure Notes (Signed)
Procedure Name: Intubation Date/Time: 12/24/2020 8:42 AM Performed by: Marena Chancy, CRNA Pre-anesthesia Checklist: Patient identified, Emergency Drugs available, Suction available and Patient being monitored Patient Re-evaluated:Patient Re-evaluated prior to induction Oxygen Delivery Method: Circle System Utilized Preoxygenation: Pre-oxygenation with 100% oxygen Induction Type: IV induction Ventilation: Mask ventilation without difficulty and Oral airway inserted - appropriate to patient size Laryngoscope Size: Hyacinth Meeker and 3 Grade View: Grade I Tube type: Oral Tube size: 8.5 mm Number of attempts: 1 Airway Equipment and Method: Stylet and Oral airway Placement Confirmation: ETT inserted through vocal cords under direct vision, positive ETCO2 and breath sounds checked- equal and bilateral Tube secured with: Tape Dental Injury: Teeth and Oropharynx as per pre-operative assessment

## 2020-12-24 NOTE — Anesthesia Postprocedure Evaluation (Signed)
Anesthesia Post Note  Patient: Darius Joyce  Procedure(s) Performed: VIDEO BRONCHOSCOPY WITH FLUORO ENDOBRONCHIAL ULTRASOUND BRONCHIAL WASHINGS BRONCHIAL NEEDLE ASPIRATION BIOPSIES BRONCHIAL BIOPSIES HEMOSTASIS CONTROL     Patient location during evaluation: PACU Anesthesia Type: General Level of consciousness: awake and alert Pain management: pain level controlled Vital Signs Assessment: post-procedure vital signs reviewed and stable Respiratory status: spontaneous breathing, nonlabored ventilation, respiratory function stable and patient connected to nasal cannula oxygen Cardiovascular status: blood pressure returned to baseline and stable Postop Assessment: no apparent nausea or vomiting Anesthetic complications: no   No notable events documented.  Last Vitals:  Vitals:   12/24/20 1015 12/24/20 1049  BP: (!) 136/59 136/85  Pulse: 86 75  Resp: 20 18  Temp:  36.7 C  SpO2: 97% 94%    Last Pain:  Vitals:   12/24/20 1049  TempSrc: Oral  PainSc:                  Fitzroy Mikami

## 2020-12-24 NOTE — Plan of Care (Signed)
  Problem: Education: Goal: Knowledge of General Education information will improve Description: Including pain rating scale, medication(s)/side effects and non-pharmacologic comfort measures 12/24/2020 1229 by Quintella Baton, RN Outcome: Adequate for Discharge 12/24/2020 0815 by Quintella Baton, RN Outcome: Progressing   Problem: Health Behavior/Discharge Planning: Goal: Ability to manage health-related needs will improve 12/24/2020 1229 by Quintella Baton, RN Outcome: Adequate for Discharge 12/24/2020 0815 by Quintella Baton, RN Outcome: Progressing   Problem: Clinical Measurements: Goal: Ability to maintain clinical measurements within normal limits will improve 12/24/2020 1229 by Quintella Baton, RN Outcome: Adequate for Discharge 12/24/2020 0815 by Quintella Baton, RN Outcome: Progressing Goal: Will remain free from infection 12/24/2020 1229 by Quintella Baton, RN Outcome: Adequate for Discharge 12/24/2020 0815 by Quintella Baton, RN Outcome: Progressing Goal: Diagnostic test results will improve 12/24/2020 1229 by Quintella Baton, RN Outcome: Adequate for Discharge 12/24/2020 0815 by Quintella Baton, RN Outcome: Progressing Goal: Respiratory complications will improve 12/24/2020 1229 by Quintella Baton, RN Outcome: Adequate for Discharge 12/24/2020 0815 by Quintella Baton, RN Outcome: Progressing Goal: Cardiovascular complication will be avoided 12/24/2020 1229 by Quintella Baton, RN Outcome: Adequate for Discharge 12/24/2020 0815 by Quintella Baton, RN Outcome: Progressing   Problem: Activity: Goal: Risk for activity intolerance will decrease 12/24/2020 1229 by Quintella Baton, RN Outcome: Adequate for Discharge 12/24/2020 0815 by Quintella Baton, RN Outcome: Progressing   Problem: Nutrition: Goal: Adequate nutrition will be maintained 12/24/2020 1229 by Quintella Baton, RN Outcome: Adequate for Discharge 12/24/2020 0815 by Quintella Baton, RN Outcome:  Progressing   Problem: Coping: Goal: Level of anxiety will decrease 12/24/2020 1229 by Quintella Baton, RN Outcome: Adequate for Discharge 12/24/2020 0815 by Quintella Baton, RN Outcome: Progressing   Problem: Elimination: Goal: Will not experience complications related to bowel motility 12/24/2020 1229 by Quintella Baton, RN Outcome: Adequate for Discharge 12/24/2020 0815 by Quintella Baton, RN Outcome: Progressing Goal: Will not experience complications related to urinary retention 12/24/2020 1229 by Quintella Baton, RN Outcome: Adequate for Discharge 12/24/2020 0815 by Quintella Baton, RN Outcome: Progressing   Problem: Pain Managment: Goal: General experience of comfort will improve 12/24/2020 1229 by Quintella Baton, RN Outcome: Adequate for Discharge 12/24/2020 0815 by Quintella Baton, RN Outcome: Progressing   Problem: Safety: Goal: Ability to remain free from injury will improve 12/24/2020 1229 by Quintella Baton, RN Outcome: Adequate for Discharge 12/24/2020 0815 by Quintella Baton, RN Outcome: Progressing   Problem: Skin Integrity: Goal: Risk for impaired skin integrity will decrease 12/24/2020 1229 by Quintella Baton, RN Outcome: Adequate for Discharge 12/24/2020 0815 by Quintella Baton, RN Outcome: Progressing

## 2020-12-24 NOTE — Anesthesia Preprocedure Evaluation (Addendum)
Anesthesia Evaluation  Patient identified by MRN, date of birth, ID band Patient awake    Reviewed: Allergy & Precautions, H&P , NPO status , Patient's Chart, lab work & pertinent test results, reviewed documented beta blocker date and time   Airway Mallampati: II  TM Distance: >3 FB Neck ROM: full    Dental no notable dental hx. (+) Poor Dentition, Chipped, Missing, Loose, Dental Advisory Given   Pulmonary shortness of breath,    Pulmonary exam normal breath sounds clear to auscultation       Cardiovascular Exercise Tolerance: Good hypertension,  Rhythm:regular Rate:Normal  ECHO 6/22 1. Left ventricular ejection fraction, by estimation, is 65 to 70%. The  left ventricle has normal function. The left ventricle has no regional  wall motion abnormalities. There is mild left ventricular hypertrophy.  Left ventricular diastolic parameters  were normal.  2. Right ventricular systolic function is normal. The right ventricular  size is normal.  3. The mitral valve is normal in structure. No evidence of mitral valve  regurgitation.  4. The aortic valve is tricuspid. Aortic valve regurgitation is not  visualized.  5. The inferior vena cava is normal in size with greater than 50%  respiratory variability, suggesting right atrial pressure of 3 mmHg.    Neuro/Psych negative neurological ROS  negative psych ROS   GI/Hepatic negative GI ROS, Neg liver ROS,   Endo/Other  Morbid obesity  Renal/GU negative Renal ROS  negative genitourinary   Musculoskeletal   Abdominal   Peds  Hematology negative hematology ROS (+)   Anesthesia Other Findings   Reproductive/Obstetrics negative OB ROS                            Anesthesia Physical Anesthesia Plan  ASA: 3  Anesthesia Plan: General   Post-op Pain Management:    Induction: Intravenous  PONV Risk Score and Plan: 2 and Ondansetron  Airway  Management Planned: Oral ETT  Additional Equipment: None  Intra-op Plan:   Post-operative Plan: Extubation in OR  Informed Consent: I have reviewed the patients History and Physical, chart, labs and discussed the procedure including the risks, benefits and alternatives for the proposed anesthesia with the patient or authorized representative who has indicated his/her understanding and acceptance.     Dental Advisory Given  Plan Discussed with: CRNA and Anesthesiologist  Anesthesia Plan Comments: (  )       Anesthesia Quick Evaluation

## 2020-12-24 NOTE — Op Note (Signed)
[facility] Cardiopulmonary Patient Name: Darius Joyce Pocedure Date: 12/24/2020 MRN: [id number] Attending MD: Senaida Ores. Shearon Stalls MD, MD Date of Birth: October 23, 1985 CSN: Finalized Age: 35 Admit Type: Inpatient Gender: Male Procedure:             Bronchoscopy Indications:           Left upper lobe mass, Bilateral hilar lymphadenopathy Providers:             Senaida Ores. Shearon Stalls MD, MD, Jeanella Cara, RN,                         Tyna Jaksch Technician Referring MD:           Medicines:             See the Anesthesia note for documentation of the                         administered medications Complications:         No immediate complications Estimated Blood Loss:  Estimated blood loss was minimal. Procedure:             Pre-Anesthesia Assessment:                        - A History and Physical has been performed. The                         patient's medications, allergies and sensitivities                         have been reviewed.                        - The risks and benefits of the procedure and the                         sedation options and risks were discussed with the                         patient. All questions were answered and informed                         consent was obtained.                        After obtaining informed consent, the bronchoscope was                         passed under direct vision. Throughout the procedure,                         the patient's blood pressure, pulse, and oxygen                         saturations were monitored continuously. the [id number]                         ([id number]) Olympus Therapeutic Bronchoscope was                         introduced through the mouth, via  the endotracheal                         tube (the patient was intubated for the procedure) and                         advanced to the tracheobronchial tree. the BF-[id number]                         ([id number]) Olympus EBUS scope was introduced  through                         the mouth, via the endotracheal tube (the patient was                         intubated for the procedure) and advanced to the                         tracheobronchial tree. The procedure was accomplished                         without difficulty. The patient tolerated the                         procedure well. The patient tolerated the procedure                         well. Scope In: Scope Out: Findings:      The nasopharynx/oropharynx appears normal. The larynx appears normal.       The vocal cords appear normal. The subglottic space is normal. The       trachea is of normal caliber. The carina is sharp. The tracheobronchial       tree of the left lung was examined to at least the first subsegmental       level. Bronchial mucosa and anatomy in the left lung are normal; there       are no endobronchial lesions, and no secretions.      The nasopharynx/oropharynx appears normal. The larynx appears normal.       The vocal cords appear normal. The subglottic space is normal. The       trachea is of normal caliber. The carina is sharp. The tracheobronchial       tree of the right lung was examined to at least the first subsegmental       level. Bronchial mucosa and anatomy in the right lung are normal; there       are no endobronchial lesions, and no secretions. EBUS scope was then       inserted and Station 7 lymph node was located. The lymph node was       enlarged and had calcifications visible suggesting a benign appearance.       Multiple passes were taken via TBNA and sent for surgical pathology.       ROSE was present to confirm presence of lymphocytes. The EBUS scope was       removed and Therapeutic scope inserted. Bronchoalveolar lavage was       performed in the LUL superior lingular segment (B4) of the lung and sent       for cell count and differential, flow cytometry, aerobic culture and  anaerobic culture. 90 mL of fluid were instilled.  30 mL were returned.       The return was cloudy. There were no mucoid plugs in the return fluid.       Transbronchial biopsies of a mass were performed in the left upper lobe       using alligator forceps and sent for microbiology and surgical       pathology. Eight biopsy passes were performed. Eight biopsy samples were       obtained. Impression:            - Left upper lobe mass                        - Bilateral hilar lymphadenopathy                        - The airway examination of the left lung was normal.                        - The airway examination of the right lung was normal.                        - Transbronchial lung biopsies were performed.                        - Bronchoalveolar lavage was performed. Moderate Sedation:      see anesthesia documentation Recommendation:        - Await BAL and biopsy results. BAL sent for flow                         cytometry, cell count, gram stain and sputum culture,                         cytology. TBNA lymph node biopsies sent for surgical                         pathology. Lung biopsies via TBBX sent for surgical                         pathology and tissue culture.                        - will arrange for outpatient follow up with                         pulmonary. Does not need to remain inpatient for                         results. Procedure Code(s):     --- Professional ---                        [id number], Bronchoscopy, rigid or flexible, including                         fluoroscopic guidance, when performed; with                         transbronchial lung biopsy(s), single lobe  31624, Bronchoscopy, rigid or flexible, including                         fluoroscopic guidance, when performed; with bronchial                         alveolar lavage Diagnosis Code(s):     --- Professional ---                        R59.0, Localized enlarged lymph nodes                        R91.8, Other nonspecific  abnormal finding of lung field CPT copyright 2019 [employer]. All rights reserved. The codes documented in this report are preliminary and upon coder review may  be revised to meet current compliance requirements. Darius Spong S. Shearon Stalls MD, MD 12/24/2020 10:03:02 AM This report has been signed electronically. Number of Addenda: 0

## 2020-12-24 NOTE — Telephone Encounter (Signed)
Please schedule for hospital follow up after bronchoscopy

## 2020-12-24 NOTE — Progress Notes (Signed)
Family Medicine Teaching Service Daily Progress Note Intern Pager: [phone]  Patient name: Darius Joyce Medical record number: [id number] Date of birth: 01/29/1986 Age: 35 y.o. Gender: male  Primary Care Provider: Patient, No Pcp Per (Inactive) Consultants: PCCM Code Status: Full  Pt Overview and Major Events to Date:  12/23/2020: Admitted to FPTS  Assessment and Plan: Mr. Darius Joyce is a 35 year old male presenting with chest pain.  PMH is significant for marijuana use.  Chest pain I B/L multifocal airspace disease Lower and mediastinal lymphadenopathy R Intracarinal soft tissue mass Multifocal pneumonia versus metastatic disease, lymphoma, pulmonary malignancy versus autoimmune/inflammatory pulmonary disease (like sarcoidosis). Patient still lots of mild chest pain with deep breathing.  HbA1c 5.9%.  Culture does not show any growth in 2 days, urine culture does not show any growth.  Patient has family history of sarcoidosis in brother and sister.  Plan is for pulmonary to do bronchoscopy this a.m. --PCCM following, appreciate their assistance --Bronchoscopy this a.m. --NPO at midnight --f/u ACE --f/u Blood culture --f/u Urine culture --Held Lovenox   HTN [id number], DBP 86-101, anxiety this a.m. is also playing a big role in his hypertension. --Start Norvasc 5 mg daily --Monitor closely  Vit D deficiency 13.67 --Start 1000 mcg daily  FEN/GI: NPO PPx: Hold Lovenox for broncoscopy   Status is: Inpatient  Remains inpatient appropriate because:Ongoing diagnostic testing needed not appropriate for outpatient work up  Dispo: The patient is from: Home              Anticipated d/c is to: Home              Patient currently is not medically stable to d/c.   Difficult to place patient No        Subjective:  No acute overnight events. Chair at bedside this morning before going for his procedure.  He complains of extreme anxiety due to not having any procedures in the  past.  Objective: Temp:  [98.1 F (36.7 C)-99.4 F (37.4 C)] 98.3 F (36.8 C) (06/21 0752) Pulse Rate:  [82-95] 95 (06/21 0752) Resp:  [18-20] 20 (06/21 0752) BP: (145-185)/(86-101) 185/101 (06/21 0752) SpO2:  [95 %-98 %] 96 % (06/21 0752) Weight:  [311 lb 15.2 oz (141.5 kg)] 311 lb 15.2 oz (141.5 kg) (06/21 0752) Physical Exam: General: Well-developed obese male lying comfortably in bed, NAD Cardiovascular: Regular rate and rhythm Respiratory: Mild decreased bibasilar lung sounds  abdomen: Soft, nontender, nondistended, bowel sounds present Extremities: No edema appreciated  Laboratory: Recent Labs  Lab 12/22/20 0631 12/23/20 0031 12/24/20 0046  WBC 6.0 6.4 6.0  HGB 14.9 13.3 13.3  HCT 44.5 39.8 39.7  PLT 329 310 306   Recent Labs  Lab 12/22/20 1253 12/23/20 0031 12/24/20 0046  NA 132* 139 138  K 3.8 3.8 4.2  CL 100 105 102  CO2 25 27 27   BUN 9 7 8   CREATININE 0.86 1.20 0.93  CALCIUM 8.6* 8.8* 9.2  PROT 6.8 6.9  --   BILITOT 0.6 0.5  --   ALKPHOS 62 64  --   ALT 21 22  --   AST 22 23  --   GLUCOSE 99 104* 94     Imaging/Diagnostic Tests: ECHOCARDIOGRAM COMPLETE  Result Date: 12/23/2020    ECHOCARDIOGRAM REPORT   Patient Name:   Darius SHELLS Date of Exam: 12/23/2020 Medical Rec #:  Jorene Minors        Height:       70.0 in Accession #:  [id number]       Weight:       311.9 lb Date of Birth:  11/08/1985         BSA:          2.521 m Patient Age:    35 years         BP:           139/84 mmHg Patient Gender: M                HR:           82 bpm. Exam Location:  Inpatient Procedure: 2D Echo, Cardiac Doppler and Color Doppler Indications:    Dyspnea  History:        Patient has no prior history of Echocardiogram examinations.  Sonographer:    Shirlean Kelly Referring Phys: [id number] TODD D MCDIARMID IMPRESSIONS  1. Left ventricular ejection fraction, by estimation, is 65 to 70%. The left ventricle has normal function. The left ventricle has no regional wall motion  abnormalities. There is mild left ventricular hypertrophy. Left ventricular diastolic parameters were normal.  2. Right ventricular systolic function is normal. The right ventricular size is normal.  3. The mitral valve is normal in structure. No evidence of mitral valve regurgitation.  4. The aortic valve is tricuspid. Aortic valve regurgitation is not visualized.  5. The inferior vena cava is normal in size with greater than 50% respiratory variability, suggesting right atrial pressure of 3 mmHg. Comparison(s): No prior Echocardiogram. Conclusion(s)/Recommendation(s): Normal biventricular function without evidence of hemodynamically significant valvular heart disease. FINDINGS  Left Ventricle: Left ventricular ejection fraction, by estimation, is 65 to 70%. The left ventricle has normal function. The left ventricle has no regional wall motion abnormalities. The left ventricular internal cavity size was normal in size. There is  mild left ventricular hypertrophy. Left ventricular diastolic parameters were normal. Right Ventricle: The right ventricular size is normal. No increase in right ventricular wall thickness. Right ventricular systolic function is normal. Left Atrium: Left atrial size was normal in size. Right Atrium: Right atrial size was normal in size. Pericardium: There is no evidence of pericardial effusion. Mitral Valve: The mitral valve is normal in structure. No evidence of mitral valve regurgitation. Tricuspid Valve: The tricuspid valve is grossly normal. Tricuspid valve regurgitation is trivial. Aortic Valve: The aortic valve is tricuspid. Aortic valve regurgitation is not visualized. Aortic valve mean gradient measures 4.0 mmHg. Aortic valve peak gradient measures 7.2 mmHg. Aortic valve area, by VTI measures 4.04 cm. Pulmonic Valve: The pulmonic valve was normal in structure. Pulmonic valve regurgitation is not visualized. Aorta: The aortic root and ascending aorta are structurally normal, with no  evidence of dilitation. Venous: The inferior vena cava is normal in size with greater than 50% respiratory variability, suggesting right atrial pressure of 3 mmHg. IAS/Shunts: No atrial level shunt detected by color flow Doppler.  LEFT VENTRICLE PLAX 2D LVIDd:         5.10 cm  Diastology LVIDs:         3.10 cm  LV e' medial:    8.70 cm/s LV PW:         1.30 cm  LV E/e' medial:  7.3 LV IVS:        1.30 cm  LV e' lateral:   10.20 cm/s LVOT diam:     2.50 cm  LV E/e' lateral: 6.2 LV SV:         95 LV SV Index:   38  LVOT Area:     4.91 cm  IVC IVC diam: 1.10 cm LEFT ATRIUM             Index       RIGHT ATRIUM           Index LA diam:        3.70 cm 1.47 cm/m  RA Area:     16.60 cm LA Vol (A2C):   74.2 ml 29.43 ml/m RA Volume:   47.00 ml  18.64 ml/m LA Vol (A4C):   74.4 ml 29.51 ml/m LA Biplane Vol: 74.3 ml 29.47 ml/m  AORTIC VALVE AV Area (Vmax):    3.77 cm AV Area (Vmean):   3.63 cm AV Area (VTI):     4.04 cm AV Vmax:           134.00 cm/s AV Vmean:          93.300 cm/s AV VTI:            0.236 m AV Peak Grad:      7.2 mmHg AV Mean Grad:      4.0 mmHg LVOT Vmax:         103.00 cm/s LVOT Vmean:        69.000 cm/s LVOT VTI:          0.194 m LVOT/AV VTI ratio: 0.82  AORTA Ao Root diam: 3.50 cm Ao Asc diam:  3.20 cm MITRAL VALVE MV Area (PHT): 3.16 cm    SHUNTS MV Decel Time: 240 msec    Systemic VTI:  0.19 m MV E velocity: 63.50 cm/s  Systemic Diam: 2.50 cm MV A velocity: 55.80 cm/s MV E/A ratio:  1.14 Zoila Shutter MD Electronically signed by Zoila Shutter MD Signature Date/Time: 12/23/2020/5:05:21 PM    Final      Dwan Hemmelgarn, Geralynn Rile, MD 12/24/2020, 8:39 AM PGY-1, [facility] FPTS Intern pager: [phone], text pages welcome

## 2020-12-24 NOTE — Interval H&P Note (Signed)
History and Physical Interval Note:  12/24/2020 8:06 AM  Darius Joyce  has presented today for surgery, with the diagnosis of hilar adenopathy.  The various methods of treatment have been discussed with the patient and family. After consideration of risks, benefits and other options for treatment, the patient has consented to  Procedure(s): VIDEO BRONCHOSCOPY WITH FLUORO (N/A) ENDOBRONCHIAL ULTRASOUND (N/A) as a surgical intervention.  The patient's history has been reviewed, patient examined, no change in status, stable for surgery.  I have reviewed the patient's chart and labs.  Questions were answered to the patient's satisfaction.     Charlott Holler

## 2020-12-24 NOTE — Plan of Care (Signed)

## 2020-12-24 NOTE — Progress Notes (Signed)
Darius Joyce to be D/C'd  per MD order.  Discussed with the patient and all questions fully answered.  VSS, Skin clean, dry and intact without evidence of skin break down, no evidence of skin tears noted.  IV catheter discontinued intact. Site without signs and symptoms of complications. Dressing and pressure applied.  An After Visit Summary was printed and given to the patient. Patient received prescription.  D/c education completed with patient/family including follow up instructions, medication list, d/c activities limitations if indicated, with other d/c instructions as indicated by MD - patient able to verbalize understanding, all questions fully answered.   Patient instructed to return to ED, call 911, or call MD for any changes in condition.   Patient to be escorted via WC, and D/C home via private auto.

## 2020-12-24 NOTE — Telephone Encounter (Signed)
Patient has been scheduled for 07/11 at 1030am. Spoke with Dr. Celine Mans about this via Epic Chat. Patient is still admitted. Information will be available on his discharge summary.   Nothing further needed.

## 2020-12-24 NOTE — Discharge Summary (Signed)
[facility] Discharge Summary  Patient name: Darius Joyce Medical record number: [id number] Date of birth: 1986/04/03 Age: 35 y.o. Gender: male Date of Admission: 12/22/2020  Date of Discharge: 12/24/2020 Admitting Physician: Lyndee Hensen, DO  Primary Care Provider: Patient, No Pcp Per (Inactive) Consultants: PCCM  Indication for Hospitalization: Chest pain I B/L multifocal airspace disease Lower and mediastinal lymphadenopathy R Intracarinal soft tissue mass  Discharge Diagnoses/Problem List:  Chest pain I B/L multifocal airspace disease Lower and mediastinal lymphadenopathy R Intracarinal soft tissue mass  2. Marijuana use disorder  Disposition: Home  Discharge Condition: Stable  Discharge Exam:  General: Well developed obese male lying comfortably in bed, NAD CV: Regular rate and rhythm Respiratory: Mild decreased bibasilar lung sounds Abdomen: Soft, non-tender, non-distended, BS+ Extremities: No edema appreeciated   Brief Hospital Course:   Chest pain I B/L multifocal airspace disease Lower and mediastinal lymphadenopathy R Intracarinal soft tissue mass Multifocal pneumonia vs metastatic disease, lymphoma, pulmonary malignancy vs autoimmune/inflammatory pulmonary disease (like sarcoidosis)  Patient presenting with chest pain (started left-sided, today on right side as well).  Patient was recently seen in urgent care (on 6/15) diagnosed with possible pneumonia and was prescribed Augmentin.  Compliant with Augmentin with no relief in chest pain.  This morning, started having pain in right chest as well and presented to ED. Has had 1 month of dry cough before starting chest pain on 6/15.  Afebrile, no leukocytosis, RR 22, breathing normally on room air.  Code Sepsis was called in ED but later discontinued. U/A normal. X-ray shows widespread bilateral peribronchial opacity with consolidation in left lung which is unchanged from 4 days ago, no  pleural effusion which might indicate bilateral pneumonia but no other signs of infections.  Not likely ACS, troponins were obtained and they are flat at 5>5.  EKG shows normal sinus rhythm, no acute ST-T wave changes. Echocardiogram was normal. sarcoidosis). Patient still lots of mild chest pain with deep breathing.  HbA1c 5.9%.  Culture does not show any growth in 2 days, urine culture does not show any growth.  Patient has family history of sarcoidosis in brother and sister.  Pulmonary scheduled for bronchoscopy on 12/24/2020 and discharged with outpatient follow up.    Marijuana use disorder Patient smokes 7-8 blunts of marijuana daily, since the age of 21.  He uses it to relieve his anxiety and to relax himself.  Denies any other drug use.  UDS is negative for other drugs except marijuana. Patient counseled for marijuana cessation.    Issues for Follow Up:  Blood pressure monitor in 1 week PCP appointment in 1 week Outpatient pulmonary follow up   Significant Procedures: Bronchoscopy  Impression: - Left upper lobe mass - Bilateral hilar lymphadenopathy - The airway examination of the left lung was normal. - The airway examination of the right lung was normal. - Transbronchial lung biopsies were performed. - Bronchoalveolar lavage was performed.   Recommendations from pulmonary  --Await BAL and biopsy results. BAL sent for flow cytometry, cell count, gram stain and sputum culture, cytology. TBNA lymph node biopsies sent for surgical pathology. --Lung biopsies via TBBX sent for surgical pathology and tissue culture. - will arrange for outpatient follow up with pulmonary. Does not need to remain inpatient for results.  Significant Labs and Imaging:  Recent Labs  Lab 12/22/20 0631 12/23/20 0031 12/24/20 0046  WBC 6.0 6.4 6.0  HGB 14.9 13.3 13.3  HCT 44.5 39.8 39.7  PLT 329 310 306  Recent Labs  Lab 12/22/20 0631 12/22/20 1253 12/23/20 0031 12/24/20 0046  NA 139 132*  139 138  K 4.1 3.8 3.8 4.2  CL 104 100 105 102  CO2 _0 GLUCOSE 119* 99 104* 94  BUN _1 CREATININE 0.98 0.86 1.20 0.93  CALCIUM 9.2 8.6* 8.8* 9.2  ALKPHOS  --  62 64  --   AST  --  22 23  --   ALT  --  21 22  --   ALBUMIN  --  3.1* 3.0*  --    DG Chest 2 View  Result Date: 12/22/2020 CLINICAL DATA:  35 year old male with recent shortness of breath and chest pain. Abnormal lungs on 12/18/2020. EXAM: CHEST - 2 VIEW COMPARISON:  12/18/2020. FINDINGS: Stable mildly low lung volumes. Normal cardiac size and mediastinal contours. Visualized tracheal air column is within normal limits. Extensive multifocal left lung airspace consolidation, mostly perihilar. Patchy and less dense right lung multifocal peribronchial opacity. These appear unchanged from 4 days ago. No pneumothorax, pulmonary edema or pleural effusion. No acute osseous abnormality identified. Negative visible bowel gas pattern. IMPRESSION: Widespread bilateral peribronchial opacity with consolidation in the left lung is unchanged from 4 days ago. No pleural effusion. This continues to resemble Bilateral Pneumonia, but if there are no signs/symptoms of infection then recommend follow-up Chest CT (IV contrast preferred) to further characterize. Electronically Signed   By: Genevie Ann M.D.   On: 12/22/2020 07:29   CT Angio Chest PE W and/or Wo Contrast  Result Date: 12/22/2020 CLINICAL DATA:  Suspected pulmonary embolus. Recent diagnosis of pneumonia. EXAM: CT ANGIOGRAPHY CHEST WITH CONTRAST TECHNIQUE: Multidetector CT imaging of the chest was performed using the standard protocol during bolus administration of intravenous contrast. Multiplanar CT image reconstructions and MIPs were obtained to evaluate the vascular anatomy. CONTRAST:  171m OMNIPAQUE IOHEXOL 350 MG/ML SOLN COMPARISON:  Chest radiograph December 22, 2020 FINDINGS: Cardiovascular: Satisfactory opacification of the pulmonary arteries to the segmental level. No evidence of  pulmonary embolism. Normal heart size. No pericardial effusion. Mediastinum/Nodes: Bulky mediastinal and bilateral hilar lymphadenopathy. 5.6 x 2.5 cm soft tissue mass containing scattered calcifications versus conglomeration of abnormal lymph nodes in the right infra carinal region in the posterior mediastinum. Normal trachea, esophagus and thyroid gland. Lungs/Pleura: Numerous areas of masslike consolidation versus soft tissue masses throughout both lungs. Large area of confluent consolidation in the left upper lobe. No evidence of pleural effusion or pneumothorax. Upper Abdomen: No acute abnormality. Musculoskeletal: No chest wall abnormality. No acute or significant osseous findings. Review of the MIP images confirms the above findings. IMPRESSION: 1. No evidence of pulmonary embolus. 2. Bulky mediastinal and bilateral hilar lymphadenopathy. 3. Numerous areas of masslike consolidation versus soft tissue masses throughout both lungs. Findings may represent multifocal pneumonia, however metastatic disease, lymphoma,pulmonary malignancy or autoimmune/inflammatory pulmonary disease are in the differential diagnosis. If patient's clinical presentation does not support multifocal pneumonia, then pulmonology consultation is recommended. These results were called by telephone at the time of interpretation on 12/22/2020 at 12:33 pm [facility] who verbally acknowledged these results. Electronically Signed   By: DFidela SalisburyM.D.   On: 12/22/2020 12:56   ECHOCARDIOGRAM COMPLETE  Result Date: 12/23/2020    ECHOCARDIOGRAM REPORT   Patient Name:   CKADIR AZUCENADate of Exam: 12/23/2020 Medical Rec #:  [id number]       Height:       70.0 in Accession #:  [id number]       Weight:       311.9 lb Date of Birth:  01-29-1986         BSA:          2.521 m Patient Age:    35 years         BP:           139/84 mmHg Patient Gender: M                HR:           82 bpm. Exam Location:  Inpatient Procedure:  2D Echo, Cardiac Doppler and Color Doppler Indications:    Dyspnea  History:        Patient has no prior history of Echocardiogram examinations.  Sonographer:    Cammy Brochure Referring Phys: [place]  1. Left ventricular ejection fraction, by estimation, is 65 to 70%. The left ventricle has normal function. The left ventricle has no regional wall motion abnormalities. There is mild left ventricular hypertrophy. Left ventricular diastolic parameters were normal.  2. Right ventricular systolic function is normal. The right ventricular size is normal.  3. The mitral valve is normal in structure. No evidence of mitral valve regurgitation.  4. The aortic valve is tricuspid. Aortic valve regurgitation is not visualized.  5. The inferior vena cava is normal in size with greater than 50% respiratory variability, suggesting right atrial pressure of 3 mmHg. Comparison(s): No prior Echocardiogram. Conclusion(s)/Recommendation(s): Normal biventricular function without evidence of hemodynamically significant valvular heart disease. FINDINGS  Left Ventricle: Left ventricular ejection fraction, by estimation, is 65 to 70%. The left ventricle has normal function. The left ventricle has no regional wall motion abnormalities. The left ventricular internal cavity size was normal in size. There is  mild left ventricular hypertrophy. Left ventricular diastolic parameters were normal. Right Ventricle: The right ventricular size is normal. No increase in right ventricular wall thickness. Right ventricular systolic function is normal. Left Atrium: Left atrial size was normal in size. Right Atrium: Right atrial size was normal in size. Pericardium: There is no evidence of pericardial effusion. Mitral Valve: The mitral valve is normal in structure. No evidence of mitral valve regurgitation. Tricuspid Valve: The tricuspid valve is grossly normal. Tricuspid valve regurgitation is trivial. Aortic Valve: The aortic  valve is tricuspid. Aortic valve regurgitation is not visualized. Aortic valve mean gradient measures 4.0 mmHg. Aortic valve peak gradient measures 7.2 mmHg. Aortic valve area, by VTI measures 4.04 cm. Pulmonic Valve: The pulmonic valve was normal in structure. Pulmonic valve regurgitation is not visualized. Aorta: The aortic root and ascending aorta are structurally normal, with no evidence of dilitation. Venous: The inferior vena cava is normal in size with greater than 50% respiratory variability, suggesting right atrial pressure of 3 mmHg. IAS/Shunts: No atrial level shunt detected by color flow Doppler.  LEFT VENTRICLE PLAX 2D LVIDd:         5.10 cm  Diastology LVIDs:         3.10 cm  LV e' medial:    8.70 cm/s LV PW:         1.30 cm  LV E/e' medial:  7.3 LV IVS:        1.30 cm  LV e' lateral:   10.20 cm/s LVOT diam:     2.50 cm  LV E/e' lateral: 6.2 LV SV:         95 LV SV Index:   38  LVOT Area:     4.91 cm  IVC IVC diam: 1.10 cm LEFT ATRIUM             Index       RIGHT ATRIUM           Index LA diam:        3.70 cm 1.47 cm/m  RA Area:     16.60 cm LA Vol (A2C):   74.2 ml 29.43 ml/m RA Volume:   47.00 ml  18.64 ml/m LA Vol (A4C):   74.4 ml 29.51 ml/m LA Biplane Vol: 74.3 ml 29.47 ml/m  AORTIC VALVE AV Area (Vmax):    3.77 cm AV Area (Vmean):   3.63 cm AV Area (VTI):     4.04 cm AV Vmax:           134.00 cm/s AV Vmean:          93.300 cm/s AV VTI:            0.236 m AV Peak Grad:      7.2 mmHg AV Mean Grad:      4.0 mmHg LVOT Vmax:         103.00 cm/s LVOT Vmean:        69.000 cm/s LVOT VTI:          0.194 m LVOT/AV VTI ratio: 0.82  AORTA Ao Root diam: 3.50 cm Ao Asc diam:  3.20 cm MITRAL VALVE MV Area (PHT): 3.16 cm    SHUNTS MV Decel Time: 240 msec    Systemic VTI:  0.19 m MV E velocity: 63.50 cm/s  Systemic Diam: 2.50 cm MV A velocity: 55.80 cm/s MV E/A ratio:  1.14 Lyman Bishop MD Electronically signed by Lyman Bishop MD Signature Date/Time: 12/23/2020/5:05:21 PM    Final     Results/Tests  Pending at Time of Discharge:ACE  Discharge Medications:  Allergies as of 12/24/2020   No Known Allergies      Medication List     STOP taking these medications    amoxicillin-clavulanate 875-125 MG tablet Commonly known as: AUGMENTIN       TAKE these medications    acetaminophen 500 MG tablet Commonly known as: TYLENOL Take 500-1,000 mg by mouth every 6 (six) hours as needed for mild pain or headache.   amLODipine 5 MG tablet Commonly known as: NORVASC Take 1 tablet (5 mg total) by mouth daily.   Vitamin D3 25 MCG tablet Commonly known as: Vitamin D Take 1 tablet (1,000 Units total) by mouth daily.        Discharge Instructions: Please refer to Patient Instructions section of EMR for full details.  Patient was counseled important signs and symptoms that should prompt return to medical care, changes in medications, dietary instructions, activity restrictions, and follow up appointments.   Follow-Up Appointments:  Follow-up [facility] Follow up.   Specialty: Internal Medicine Why: February 05, 2021 at 2 pm Contact information: [id number] [place]        Spero Geralds, MD. Schedule an appointment as soon as possible for a visit in 1 week(s).   Specialty: Pulmonary Disease Why: Pulmonary will make an outpatient appointment with patient. Contact information: [address] [place] [address] [phone]                 Honor Junes, MD 12/24/2020, 12:22 PM PGY-1, [place]

## 2020-12-25 LAB — SURGICAL PATHOLOGY

## 2020-12-26 ENCOUNTER — Encounter (HOSPITAL_COMMUNITY): Payer: Self-pay | Admitting: Internal Medicine

## 2020-12-26 ENCOUNTER — Telehealth: Payer: Self-pay | Admitting: Internal Medicine

## 2020-12-26 LAB — CYTOLOGY - NON PAP

## 2020-12-26 NOTE — Telephone Encounter (Signed)
CT scan shows enlarged lymph nodes in chest and findings are suspicious for sarcoidosis. We will discuss what the biopsy results show when they are back next week.  No restrictions to return to work.

## 2020-12-26 NOTE — Telephone Encounter (Signed)
Patient called to get clarity on CT scan results he had in the ED on 6/19.  Patient also has questions regarding when he can return to work.  Advised patient that I will send a message to Dr. Celine Mans and call him back.   Dr. Celine Mans please advise  Thank you

## 2020-12-26 NOTE — Telephone Encounter (Signed)
Called and spoke with pt and he is aware of these results per ND.  Nothing further is needed.

## 2020-12-27 LAB — SURGICAL PATHOLOGY

## 2020-12-27 LAB — CULTURE, BLOOD (ROUTINE X 2): Culture: NO GROWTH

## 2020-12-27 LAB — CYTOLOGY - NON PAP

## 2020-12-29 LAB — AEROBIC/ANAEROBIC CULTURE W GRAM STAIN (SURGICAL/DEEP WOUND): Culture: NO GROWTH

## 2021-01-13 ENCOUNTER — Encounter: Payer: Self-pay | Admitting: Internal Medicine

## 2021-01-13 ENCOUNTER — Ambulatory Visit (INDEPENDENT_AMBULATORY_CARE_PROVIDER_SITE_OTHER): Payer: Self-pay | Admitting: Internal Medicine

## 2021-01-13 ENCOUNTER — Telehealth: Payer: Self-pay | Admitting: Internal Medicine

## 2021-01-13 ENCOUNTER — Other Ambulatory Visit: Payer: Self-pay

## 2021-01-13 VITALS — BP 136/74 | HR 80 | Temp 98.6°F | Ht 70.0 in | Wt 308.4 lb

## 2021-01-13 DIAGNOSIS — I10 Essential (primary) hypertension: Secondary | ICD-10-CM

## 2021-01-13 DIAGNOSIS — D860 Sarcoidosis of lung: Secondary | ICD-10-CM

## 2021-01-13 MED ORDER — FLUTICASONE FUROATE-VILANTEROL 100-25 MCG/INH IN AEPB: 1.0000 | INHALATION_SPRAY | Freq: Every day | RESPIRATORY_TRACT | 0 refills | Status: DC

## 2021-01-13 MED ORDER — ALBUTEROL SULFATE HFA 108 (90 BASE) MCG/ACT IN AERS: 2.0000 | INHALATION_SPRAY | Freq: Four times a day (QID) | RESPIRATORY_TRACT | 5 refills | Status: AC | PRN

## 2021-01-13 MED ORDER — AMLODIPINE BESYLATE 5 MG PO TABS: 5.0000 mg | ORAL_TABLET | Freq: Every day | ORAL | 0 refills | Status: DC

## 2021-01-13 NOTE — Progress Notes (Signed)
Augusto Deckman    [id number]    July 28, 1985  Primary Care Physician:Patient, No Pcp Per (Inactive) Date of Appointment: 01/13/2021 Established Patient Visit  Chief complaint:   Chief Complaint  Patient presents with   Follow-up     HPI: Zimere Dunlevy is a 35 y.o. man with shortness of breath who presented to hospital for chest pain June 2022. Had CTPE study negative for PE. Hilar adenopathy suspicious for sarcoidosis. Had EBUS TBNA.   Interval Updates: Here for hospital follow up. Station 7 LN showed non-necrotizing granulomas. His chest pain is resolved since discharge. BAL showed PMNs, cultures negative. Virus panel negative. Flow was not sent.   Does have shortness of breath walking to work - [employer]. Usually with exertion. Has been feeling better since stopped smoking marijuana and drinking alcohol. Here with his mom today. The patient's brother and sister both have sarcoidosis, but not his mother.    I have reviewed the patient's family social and past medical history and updated as appropriate.   Past Medical History:  Diagnosis Date   Hilar adenopathy 12/23/2020    Past Surgical History:  Procedure Laterality Date   BRONCHIAL BIOPSY  12/24/2020   Procedure: BRONCHIAL BIOPSIES;  Surgeon: Spero Geralds, MD;  Location[facility] ENDOSCOPY;  Service: Pulmonary;;   BRONCHIAL NEEDLE ASPIRATION BIOPSY  12/24/2020   Procedure: BRONCHIAL NEEDLE ASPIRATION BIOPSIES;  Surgeon: Spero Geralds, MD;  Location: [place];  Service: Pulmonary;;   BRONCHIAL WASHINGS  12/24/2020   Procedure: BRONCHIAL WASHINGS;  Surgeon: Spero Geralds, MD;  Location: [facility] ENDOSCOPY;  Service: Pulmonary;;   ENDOBRONCHIAL ULTRASOUND N/A 12/24/2020   Procedure: ENDOBRONCHIAL ULTRASOUND;  Surgeon: Spero Geralds, MD;  Location: [facility] ENDOSCOPY;  Service: Pulmonary;  Laterality: N/A;   HEMOSTASIS CONTROL  12/24/2020   Procedure: HEMOSTASIS CONTROL;  Surgeon: Spero Geralds, MD;  Location: [facility]  ENDOSCOPY;  Service: Pulmonary;;   VIDEO BRONCHOSCOPY N/A 12/24/2020   Procedure: VIDEO BRONCHOSCOPY WITH FLUORO;  Surgeon: Spero Geralds, MD;  Location: [facility] ENDOSCOPY;  Service: Pulmonary;  Laterality: N/A;    Family History  Problem Relation Age of Onset   Hypertension Mother    Hypertension Father    Pulmonary disease Brother    Asthma Brother     Social History   Occupational History   Not on file  Tobacco Use   Smoking status: Never   Smokeless tobacco: Never  Vaping Use   Vaping Use: Never used  Substance and Sexual Activity   Alcohol use: Yes    Comment: rarely   Drug use: Yes    Types: Marijuana    Comment: smokes daily   Sexual activity: Yes     Physical Exam: Blood pressure 136/74, pulse 80, temperature 98.6 F (37 C), temperature source Oral, height _0  (1.778 m), weight (!) 308 lb 6.4 oz (139.9 kg), SpO2 98 %.  Gen:      No acute distress ENT:  no nasal polyps, mucus membranes moist Lungs:    No increased respiratory effort, symmetric chest wall excursion, clear to auscultation bilaterally, no wheezes or crackles CV:         Regular rate and rhythm; no murmurs, rubs, or gallops.  No pedal edema   Data Reviewed: Imaging: I have personally reviewed the CTPE study Jun 2022 which shows multiple nodular opacities in the bilateral lower lung fields as well as hilar adenopathy.   PFTs: No flowsheet data found. None on file  Labs: CMP WNL CBC WNL Calcium 9.2  Immunization status:  There is no immunization history on file for this patient.  Assessment:  Pulmonary Sarcoidosis Shortness of breath Morbid obesity Body mass index is 44.25 kg/m.   Plan/Recommendations: Will obtain full set of PFTs and refer to ophthalmology Will trial Breo to see if he has any benefit in his dyspnea until we can obtain PFTs. Will also trial albuterol prn.  Discussed disease progression and management at length today, all questions answered.    I have reviewed  the following studies for sarcoid today -  Baseline eye examination to evaluate for ocular sarcoidosis Baseline serum creatinine for renal sarcoidosis Baseline serum alkaline phosphatase for hepatic sarcoidosis Baseline serum calcium Baseline serum complete blood count Baseline EKG for cardiac sarcoidosis  Reference: Diagnosis and Detection of Sarcoidosis: An Therapist, art [place] [address], Iss 8, pp [id number], Oct 19, 2018   Return to Care: Return in about 6 months (around 07/16/2021).   Lenice Llamas, MD Pulmonary and [place]

## 2021-01-13 NOTE — Patient Instructions (Addendum)
The patient should have follow up scheduled with myself in 6 months.   Prior to next visit patient should have: Full set of PFTs  Start taking Breo inhaler once a day. Gargle after use. Call me if it helps you, and I will prescribe it.   Take the albuterol rescue inhaler every 4 to 6 hours as needed for wheezing or shortness of breath. You can also take it 15 minutes before exercise or exertional activity. Side effects include heart racing or pounding, jitters or anxiety. If you have a history of an irregular heart rhythm, it can make this worse. Can also give some patients a hard time sleeping.  To inhale the aerosol using an inhaler, follow these steps:  Remove the protective dust cap from the end of the mouthpiece. If the dust cap was not placed on the mouthpiece, check the mouthpiece for dirt or other objects. Be sure that the canister is fully and firmly inserted in the mouthpiece. 2. If you are using the inhaler for the first time or if you have not used the inhaler in more than 14 days, you will need to prime it. You may also need to prime the inhaler if it has been dropped. Ask your pharmacist or check the manufacturer's information if this happens. To prime the inhaler, shake it well and then press down on the canister 4 times to release 4 sprays into the air, away from your face. Be careful not to get albuterol in your eyes. 3. Shake the inhaler well. 4. Breathe out as completely as possible through your mouth. 4. Hold the canister with the mouthpiece on the bottom, facing you and the canister pointing upward. Place the open end of the mouthpiece into your mouth. Close your lips tightly around the mouthpiece. 6. Breathe in slowly and deeply through the mouthpiece.At the same time, press down once on the container to spray the medication into your mouth. 7. Try to hold your breath for 10 seconds. remove the inhaler, and breathe out slowly. 8. If you were told to use 2 puffs, wait 1  minute and then repeat steps 3-7. 9. Replace the protective cap on the inhaler. 10. Clean your inhaler regularly. Follow the manufacturer's directions carefully and ask your doctor or pharmacist if you have any questions about cleaning your inhaler.  Check the back of the inhaler to keep track of the total number of doses left on the inhaler.       Sarcoidosis  Sarcoidosis is a disease that can cause inflammation in many areas of the body. It most often affects the lungs (pulmonary sarcoidosis). Sarcoidosis can also affect the lymph nodes, liver, eyes, skin, heart, orany other body tissue. Normally, cells that are part of the body's disease-fighting system (immune system) attack harmful substances in the body, such as germs. This immune system response causes inflammation. After the harmful substance is destroyed, the inflammation goes away. When you have sarcoidosis, your immune system causes inflammation even when there are no harmful substances, and the inflammation does not go away. Sarcoidosis also causes cells from your immune system to form small lumps (granulomas) in the affected area of your body. What are the causes? The exact cause of sarcoidosis is not known.  If you have a family history of this disease (genetic predisposition), the immune system response that leads to inflammation may be triggered by something in your environment, such as: Bacteria or viruses. Metals. Chemicals. Dust. Mold or mildew. What increases the risk? You may  be more likely to develop this condition if you: Have a family history of the disease. Are African American. Are of Northern European descent. Are 16-26 years old. Are male. Work as a IT sales professional. Work in an environment where you are exposed to metals, chemicals, mold or mildew, or insecticides. What are the signs or symptoms? Some people with sarcoidosis have no symptoms. Others have very mild symptoms. The symptoms usually depend on the  organ that is affected. Sarcoidosis most often affects the lungs, which may lead to symptoms such as: Chest pain. Coughing. Wheezing. Shortness of breath. Other common symptoms include: Night sweats. Fever. Weight loss. Tiredness (fatigue). Swollen lymph nodes. Joint pain. How is this diagnosed? This condition may be diagnosed based on: Your symptoms and medical history. A physical exam. Imaging tests such as: Chest X-ray. CT scan. MRI. PET scan. Lung function tests. These tests evaluate your breathing and check for problems that may be related to sarcoidosis. A procedure to remove a tissue sample for testing (biopsy). You may have a biopsy of lung tissue if that is where you are having symptoms. You may have tests to check for any complications of the condition. These tests may include: Eye exams. MRI of the heart or brain. Echocardiogram. ECG (electrocardiogram). How is this treated? In some cases, sarcoidosis does not require a specific treatment because it causes no symptoms or only mild symptoms. If your symptoms bother you or are severe, you may be prescribed medicines to reduce inflammation or relieve symptoms. These medicines may include: Prednisone. This is a steroid that reduces inflammation related to sarcoidosis. Hydroxychloroquine. This may be used to treat sarcoidosis that affects the skin, eyes, or brain. Certain medicines that affect the immune system. These can help with sarcoidosis in the joints, eyes, skin, or lungs. Medicines that you breathe in (inhalers). Inhalers can help you breathe if sarcoidosis affects your lungs. Follow these instructions at home:  Do not use any products that contain nicotine or tobacco. These products include cigarettes, chewing tobacco, and vaping devices, such as e-cigarettes. If you need help quitting, ask your health care provider. Avoid secondhand smoke and irritating dust or chemicals. Stay indoors on days when air quality is  poor in your area. Return to your normal activities as told by your health care provider. Ask your health care provider what activities are safe for you. Take or use over-the-counter and prescription medicines only as told by your health care provider. Keep all follow-up visits. This is important. Where to find more information National Heart, Lung, and Blood Institute: [employer] Contact a health care provider if: You have vision problems. You have a dry cough that does not go away. You have an irregular heartbeat. You have pain or aches in your joints, hands, or feet. You have an unexplained rash. Get help right away if: You have chest pain. You have trouble breathing. These symptoms may represent a serious problem that is an emergency. Do not wait to see if the symptoms will go away. Get medical help right away. Call your local emergency services (911 in the [place] Do not drive yourself to the hospital. Summary Sarcoidosis is a disease that can cause inflammation in many body areas of the body. It most often affects the lungs (pulmonary sarcoidosis). It can also affect the lymph nodes, liver, eyes, skin, heart, or any other body tissue. When you have sarcoidosis, cells from your immune system form small lumps (granulomas) in the affected area of your body. Sarcoidosis sometimes does not  require a specific treatment because it causes no symptoms or only mild symptoms. If your symptoms bother you or are severe, you may be prescribed medicines to reduce inflammation or relieve symptoms. This information is not intended to replace advice given to you by your health care provider. Make sure you discuss any questions you have with your healthcare provider. Document Revised: 04/23/2020 Document Reviewed: 04/23/2020 Elsevier Patient Education  2022 [employer].

## 2021-01-13 NOTE — Telephone Encounter (Signed)
Called and spoke with patient. He stated that he is in need of a refill on his amlopidine and does not have a PCP. He wanted to see if Dr. Celine Mans would be able to refill it for him. I advised him that I would need to ask her permission before sending in the medication, he verbalized understanding.   Spoke with Dr. Celine Mans via secure chat. She is ok with a 30 day supply and he will need to find a PCP.   RX has been sent.   Nothing further needed at time of call.

## 2021-01-14 ENCOUNTER — Other Ambulatory Visit (HOSPITAL_COMMUNITY)
Admission: RE | Admit: 2021-01-14 | Discharge: 2021-01-14 | Disposition: A | Discharge status: Home / Self Care | Payer: Self-pay | Source: Ambulatory Visit | Attending: Internal Medicine | Admitting: Internal Medicine

## 2021-01-14 DIAGNOSIS — Z20822 Contact with and (suspected) exposure to covid-19: Secondary | ICD-10-CM | POA: Insufficient documentation

## 2021-01-14 DIAGNOSIS — Z01812 Encounter for preprocedural laboratory examination: Secondary | ICD-10-CM | POA: Insufficient documentation

## 2021-01-14 LAB — SARS CORONAVIRUS 2 (TAT 6-24 HRS): SARS Coronavirus 2: NEGATIVE

## 2021-01-15 ENCOUNTER — Telehealth: Payer: Self-pay | Admitting: Internal Medicine

## 2021-01-15 NOTE — Telephone Encounter (Signed)
Called [facility] but the office was currently closed. Also could not leave a VM. Will try back tomorrow 7/14.\

## 2021-01-15 NOTE — Telephone Encounter (Signed)
Verlon Au stated that they are wanting to get clarification for a referral that was sent over to their office for the patient. Stated that we sent a referral over to them for basically swelling in the eye and she contacted the pt and they stated that they did not have that and was informed that he just needed an annual eye exam so they were just wanting to clarify.  Pls regard; [phone]

## 2021-01-16 NOTE — Telephone Encounter (Signed)
Spoke with Verlon Au at [facility]. 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 She verbalized understanding and will make appt. Nothing further needed.

## 2021-01-17 ENCOUNTER — Ambulatory Visit (INDEPENDENT_AMBULATORY_CARE_PROVIDER_SITE_OTHER): Payer: Self-pay | Admitting: Internal Medicine

## 2021-01-17 ENCOUNTER — Other Ambulatory Visit: Payer: Self-pay

## 2021-01-17 DIAGNOSIS — D860 Sarcoidosis of lung: Secondary | ICD-10-CM

## 2021-01-17 LAB — PULMONARY FUNCTION TEST
DL/VA % pred: 128 %
DL/VA: 6.13 ml/min/mmHg/L
DLCO cor % pred: 83 %
DLCO cor: 26.8 ml/min/mmHg
DLCO unc % pred: 80 %
DLCO unc: 25.76 ml/min/mmHg
FEF 25-75 Post: 3.58 L/sec
FEF 25-75 Pre: 3.3 L/sec
FEF2575-%Change-Post: 8 %
FEF2575-%Pred-Post: 90 %
FEF2575-%Pred-Pre: 83 %
FEV1-%Change-Post: 3 %
FEV1-%Pred-Post: 75 %
FEV1-%Pred-Pre: 72 %
FEV1-Post: 2.79 L
FEV1-Pre: 2.69 L
FEV1FVC-%Change-Post: 2 %
FEV1FVC-%Pred-Pre: 104 %
FEV6-%Change-Post: 0 %
FEV6-%Pred-Post: 71 %
FEV6-%Pred-Pre: 71 %
FEV6-Post: 3.15 L
FEV6-Pre: 3.13 L
FEV6FVC-%Pred-Post: 101 %
FEV6FVC-%Pred-Pre: 101 %
FVC-%Change-Post: 0 %
FVC-%Pred-Post: 70 %
FVC-%Pred-Pre: 69 %
FVC-Post: 3.15 L
FVC-Pre: 3.13 L
Post FEV1/FVC ratio: 88 %
Post FEV6/FVC ratio: 100 %
Pre FEV1/FVC ratio: 86 %
Pre FEV6/FVC Ratio: 100 %
RV % pred: 65 %
RV: 1.13 L
TLC % pred: 67 %
TLC: 4.64 L

## 2021-01-17 NOTE — Progress Notes (Signed)
PFT done today. 

## 2021-02-05 ENCOUNTER — Ambulatory Visit (INDEPENDENT_AMBULATORY_CARE_PROVIDER_SITE_OTHER): Payer: Self-pay | Admitting: Nurse Practitioner

## 2021-02-05 ENCOUNTER — Encounter: Payer: Self-pay | Admitting: Nurse Practitioner

## 2021-02-05 ENCOUNTER — Other Ambulatory Visit: Payer: Self-pay

## 2021-02-05 VITALS — BP 148/88 | HR 88 | Temp 98.0°F | Ht 70.0 in | Wt 307.0 lb

## 2021-02-05 DIAGNOSIS — R7303 Prediabetes: Secondary | ICD-10-CM

## 2021-02-05 DIAGNOSIS — Z7689 Persons encountering health services in other specified circumstances: Secondary | ICD-10-CM

## 2021-02-05 DIAGNOSIS — I10 Essential (primary) hypertension: Secondary | ICD-10-CM

## 2021-02-05 MED ORDER — AMLODIPINE BESYLATE 5 MG PO TABS: 5.0000 mg | ORAL_TABLET | Freq: Every day | ORAL | 3 refills | Status: DC

## 2021-02-05 NOTE — Patient Instructions (Addendum)
Managing Your Hypertension Hypertension, also called high blood pressure, is when the force of the blood pressing against the walls of the arteries is too strong. Arteries are blood vessels that carry blood from your heart throughout your body. Hypertension forces the heart to work harder to pump blood and may cause the arteries tobecome narrow or stiff. Understanding blood pressure readings Your personal target blood pressure may vary depending on your medical conditions, your age, and other factors. A blood pressure reading includes a higher number over a lower number. Ideally, your blood pressure should be below 120/80. You should know that: The first, or top, number is called the systolic pressure. It is a measure of the pressure in your arteries as your heart beats. The second, or bottom number, is called the diastolic pressure. It is a measure of the pressure in your arteries as the heart relaxes. Blood pressure is classified into four stages. Based on your blood pressure reading, your health care provider may use the following stages to determine what type of treatment you need, if any. Systolic pressure and diastolicpressure are measured in a unit called mmHg. Normal Systolic pressure: below 120. Diastolic pressure: below 80. Elevated Systolic pressure: 120-129. Diastolic pressure: below 80. Hypertension stage 1 Systolic pressure: 130-139. Diastolic pressure: 80-89. Hypertension stage 2 Systolic pressure: 140 or above. Diastolic pressure: 90 or above. How can this condition affect me? Managing your hypertension is an important responsibility. Over time, hypertension can damage the arteries and decrease blood flow to important parts of the body, including the brain, heart, and kidneys. Having untreated or uncontrolled hypertension can lead to: A heart attack. A stroke. A weakened blood vessel (aneurysm). Heart failure. Kidney damage. Eye damage. Metabolic syndrome. Memory and  concentration problems. Vascular dementia. What actions can I take to manage this condition? Hypertension can be managed by making lifestyle changes and possibly by taking medicines. Your health care provider will help you make a plan to bring yourblood pressure within a normal range. Nutrition  Eat a diet that is high in fiber and potassium, and low in salt (sodium), added sugar, and fat. An example eating plan is called the Dietary Approaches to Stop Hypertension (DASH) diet. To eat this way: Eat plenty of fresh fruits and vegetables. Try to fill one-half of your plate at each meal with fruits and vegetables. Eat whole grains, such as whole-wheat pasta, brown rice, or whole-grain bread. Fill about one-fourth of your plate with whole grains. Eat low-fat dairy products. Avoid fatty cuts of meat, processed or cured meats, and poultry with skin. Fill about one-fourth of your plate with lean proteins such as fish, chicken without skin, beans, eggs, and tofu. Avoid pre-made and processed foods. These tend to be higher in sodium, added sugar, and fat. Reduce your daily sodium intake. Most people with hypertension should eat less than 1,500 mg of sodium a day.  Lifestyle  Work with your health care provider to maintain a healthy body weight or to lose weight. Ask what an ideal weight is for you. Get at least 30 minutes of exercise that causes your heart to beat faster (aerobic exercise) most days of the week. Activities may include walking, swimming, or biking. Include exercise to strengthen your muscles (resistance exercise), such as weight lifting, as part of your weekly exercise routine. Try to do these types of exercises for 30 minutes at least 3 days a week. Do not use any products that contain nicotine or tobacco, such as cigarettes, e-cigarettes, and chewing   tobacco. If you need help quitting, ask your health care provider. Control any long-term (chronic) conditions you have, such as high  cholesterol or diabetes. Identify your sources of stress and find ways to manage stress. This may include meditation, deep breathing, or making time for fun activities.  Alcohol use Do not drink alcohol if: Your health care provider tells you not to drink. You are pregnant, may be pregnant, or are planning to become pregnant. If you drink alcohol: Limit how much you use to: 0-1 drink a day for women. 0-2 drinks a day for men. Be aware of how much alcohol is in your drink. In the [place] one drink equals one 12 oz bottle of beer (355 mL), one 5 oz glass of wine (148 mL), or one 1 oz glass of hard liquor (44 mL). Medicines Your health care provider may prescribe medicine if lifestyle changes are not enough to get your blood pressure under control and if: Your systolic blood pressure is 130 or higher. Your diastolic blood pressure is 80 or higher. Take medicines only as told by your health care provider. Follow the directions carefully. Blood pressure medicines must be taken as told by your health care provider. The medicine does not work as well when you skip doses. Skippingdoses also puts you at risk for problems. Monitoring Before you monitor your blood pressure: Do not smoke, drink caffeinated beverages, or exercise within 30 minutes before taking a measurement. Use the bathroom and empty your bladder (urinate). Sit quietly for at least 5 minutes before taking measurements. Monitor your blood pressure at home as told by your health care provider. To do this: Sit with your back straight and supported. Place your feet flat on the floor. Do not cross your legs. Support your arm on a flat surface, such as a table. Make sure your upper arm is at heart level. Each time you measure, take two or three readings one minute apart and record the results. You may also need to have your blood pressure checked regularly by your healthcare provider. General information Talk with your health care  provider about your diet, exercise habits, and other lifestyle factors that may be contributing to hypertension. Review all the medicines you take with your health care provider because there may be side effects or interactions. Keep all visits as told by your health care provider. Your health care provider can help you create and adjust your plan for managing your high blood pressure. Where to find more information National Heart, Lung, and Blood Institute: www.nhlbi.nih.gov American Heart Association: www.heart.org Contact a health care provider if: You think you are having a reaction to medicines you have taken. You have repeated (recurrent) headaches. You feel dizzy. You have swelling in your ankles. You have trouble with your vision. Get help right away if: You develop a severe headache or confusion. You have unusual weakness or numbness, or you feel faint. You have severe pain in your chest or abdomen. You vomit repeatedly. You have trouble breathing. These symptoms may represent a serious problem that is an emergency. Do not wait to see if the symptoms will go away. Get medical help right away. Call your local emergency services (911 in the [place] Do not drive yourself to the hospital. Summary Hypertension is when the force of blood pumping through your arteries is too strong. If this condition is not controlled, it may put you at risk for serious complications. Your personal target blood pressure may vary depending on your medical conditions,   your age, and other factors. For most people, a normal blood pressure is less than 120/80. Hypertension is managed by lifestyle changes, medicines, or both. Lifestyle changes to help manage hypertension include losing weight, eating a healthy, low-sodium diet, exercising more, stopping smoking, and limiting alcohol. This information is not intended to replace advice given to you by your health care provider. Make sure you discuss any questions  you have with your healthcare provider. Document Revised: 07/28/2019 Document Reviewed: 05/23/2019 Elsevier Patient Education  2022 Elsevier Inc.  Prediabetes Eating Plan Prediabetes is a condition that causes blood sugar (glucose) levels to be higher than normal. This increases the risk for developing type 2 diabetes (type 2 diabetes mellitus). Working with a health care provider or nutrition specialist (dietitian) to make diet and lifestyle changes can help prevent the onset of diabetes. These changes may help you: Control your blood glucose levels. Improve your cholesterol levels. Manage your blood pressure. What are tips for following this plan? Reading food labels Read food labels to check the amount of fat, salt (sodium), and sugar in prepackaged foods. Avoid foods that have: Saturated fats. Trans fats. Added sugars. Avoid foods that have more than 300 milligrams (mg) of sodium per serving. Limit your sodium intake to less than 2,300 mg each day. Shopping Avoid buying pre-made and processed foods. Avoid buying drinks with added sugar. Cooking Cook with olive oil. Do not use butter, lard, or ghee. Bake, broil, grill, steam, or boil foods. Avoid frying. Meal planning  Work with your dietitian to create an eating plan that is right for you. This may include tracking how many calories you take in each day. Use a food diary, notebook, or mobile application to track what you eat at each meal. Consider following a Mediterranean diet. This includes: Eating several servings of fresh fruits and vegetables each day. Eating fish at least twice a week. Eating one serving each day of whole grains, beans, nuts, and seeds. Using olive oil instead of other fats. Limiting alcohol. Limiting red meat. Using nonfat or low-fat dairy products. Consider following a plant-based diet. This includes dietary choices that focus on eating mostly vegetables and fruit, grains, beans, nuts, and seeds. If you  have high blood pressure, you may need to limit your sodium intake or follow a diet such as the DASH (Dietary Approaches to Stop Hypertension) eating plan. The DASH diet aims to lower high blood pressure.  Lifestyle Set weight loss goals with help from your health care team. It is recommended that most people with prediabetes lose 7% of their body weight. Exercise for at least 30 minutes 5 or more days a week. Attend a support group or seek support from a mental health counselor. Take over-the-counter and prescription medicines only as told by your health care provider. What foods are recommended? Fruits Berries. Bananas. Apples. Oranges. Grapes. Papaya. Mango. Pomegranate. Kiwi.Grapefruit. Cherries. Vegetables Lettuce. Spinach. Peas. Beets. Cauliflower. Cabbage. Broccoli. Carrots.Tomatoes. Squash. Eggplant. Herbs. Peppers. Onions. Cucumbers. Brussels sprouts. Grains Whole grains, such as whole-wheat or whole-grain breads, crackers, cereals, and pasta. Unsweetened oatmeal. Bulgur. Barley. Quinoa. Brown rice. Corn orwhole-wheat flour tortillas or taco shells. Meats and other proteins Seafood. Poultry without skin. Lean cuts of pork and beef. Tofu. Eggs. Nuts.Beans. Dairy Low-fat or fat-free dairy products, such as yogurt, cottage cheese, and cheese. Beverages Water. Tea. Coffee. Sugar-free or diet soda. Seltzer water. Low-fat or nonfatmilk. Milk alternatives, such as soy or almond milk. Fats and oils Olive oil. Canola oil. Sunflower oil. Grapeseed oil.   Avocado. Walnuts. Sweets and desserts Sugar-free or low-fat pudding. Sugar-free or low-fat ice cream and other frozentreats. Seasonings and condiments Herbs. Sodium-free spices. Mustard. Relish. Low-salt, low-sugar ketchup.Low-salt, low-sugar barbecue sauce. Low-fat or fat-free mayonnaise. The items listed above may not be a complete list of recommended foods and beverages. Contact a dietitian for more information. What foods are not  recommended? Fruits Fruits canned with syrup. Vegetables Canned vegetables. Frozen vegetables with butter or cream sauce. Grains Refined white flour and flour products, such as bread, pasta, snack foods, andcereals. Meats and other proteins Fatty cuts of meat. Poultry with skin. Breaded or fried meat. Processed meats. Dairy Full-fat yogurt, cheese, or milk. Beverages Sweetened drinks, such as iced tea and soda. Fats and oils Butter. Lard. Ghee. Sweets and desserts Baked goods, such as cake, cupcakes, pastries, cookies, and cheesecake. Seasonings and condiments Spice mixes with added salt. Ketchup. Barbecue sauce. Mayonnaise. The items listed above may not be a complete list of foods and beverages that are not recommended. Contact a dietitian for more information. Where to find more information American Diabetes Association: www.diabetes.org Summary You may need to make diet and lifestyle changes to help prevent the onset of diabetes. These changes can help you control blood sugar, improve cholesterol levels, and manage blood pressure. Set weight loss goals with help from your health care team. It is recommended that most people with prediabetes lose 7% of their body weight. Consider following a Mediterranean diet. This includes eating plenty of fresh fruits and vegetables, whole grains, beans, nuts, seeds, fish, and low-fat dairy, and using olive oil instead of other fats. This information is not intended to replace advice given to you by your health care provider. Make sure you discuss any questions you have with your healthcare provider. Document Revised: 09/21/2019 Document Reviewed: 09/21/2019 Elsevier Patient Education  2022 Elsevier Inc.   

## 2021-02-05 NOTE — Progress Notes (Signed)
[facility] Patient [facility] [address] [place], [place]  [address] Phone:  [phone]   Fax:  [phone]    New Patient Office Visit  Subjective:  Patient ID: Darius Joyce, male    DOB: January 29, 1986  Age: 35 y.o. MRN: [id number]  CC:  Chief Complaint  Patient presents with   Establish Care    Hospitalized 12/22/2020 - 12/24/2020;     HPI Luisantonio Adinolfi presents to establish care. He  has a past medical history of Hilar adenopathy (12/23/2020).   He was diagnosed and treated for multilobe pneumonia.  While in the hospital he was diagnosed with hypertension and started on amlodipine 5 mg.  He was also diagnosed with pulmonary sarcoidosis for which he is following up with pulmonology.  He reports that he stopped smoking ; cold [place].  Denies headache, dizziness, visual changes, chest pain, nausea, vomiting or any edema.    Past Medical History:  Diagnosis Date   Hilar adenopathy 12/23/2020    Past Surgical History:  Procedure Laterality Date   BRONCHIAL BIOPSY  12/24/2020   Procedure: BRONCHIAL BIOPSIES;  Surgeon: Charlott Holler, MD;  Location: [facility] ENDOSCOPY;  Service: Pulmonary;;   BRONCHIAL NEEDLE ASPIRATION BIOPSY  12/24/2020   Procedure: BRONCHIAL NEEDLE ASPIRATION BIOPSIES;  Surgeon: Charlott Holler, MD;  Location: [facility] ENDOSCOPY;  Service: Pulmonary;;   BRONCHIAL WASHINGS  12/24/2020   Procedure: BRONCHIAL WASHINGS;  Surgeon: Charlott Holler, MD;  Location: [facility] ENDOSCOPY;  Service: Pulmonary;;   ENDOBRONCHIAL ULTRASOUND N/A 12/24/2020   Procedure: ENDOBRONCHIAL ULTRASOUND;  Surgeon: Charlott Holler, MD;  Location: [facility] ENDOSCOPY;  Service: Pulmonary;  Laterality: N/A;   HEMOSTASIS CONTROL  12/24/2020   Procedure: HEMOSTASIS CONTROL;  Surgeon: Charlott Holler, MD;  Location[facility] ENDOSCOPY;  Service: Pulmonary;;   VIDEO BRONCHOSCOPY N/A 12/24/2020   Procedure: VIDEO BRONCHOSCOPY WITH FLUORO;  Surgeon: Charlott Holler, MD;  Location: [facility] ENDOSCOPY;  Service: Pulmonary;   Laterality: N/A;    Family History  Problem Relation Age of Onset   Hypertension Mother    Hypertension Father    Pulmonary disease Brother    Asthma Brother     Social History   Socioeconomic History   Marital status: Single    Spouse name: Not on file   Number of children: Not on file   Years of education: Not on file   Highest education level: Not on file  Occupational History   Not on file  Tobacco Use   Smoking status: Never   Smokeless tobacco: Never  Vaping Use   Vaping Use: Never used  Substance and Sexual Activity   Alcohol use: Yes    Comment: rarely   Drug use: Yes    Types: Marijuana    Comment: smokes daily   Sexual activity: Yes  Other Topics Concern   Not on file  Social History Narrative   Not on file   Social Determinants of Health   Financial Resource Strain: Not on file  Food Insecurity: Not on file  Transportation Needs: Not on file  Physical Activity: Not on file  Stress: Not on file  Social Connections: Not on file  Intimate Partner Violence: Not on file    ROS Review of Systems  Respiratory:  Positive for shortness of breath (scardosis).   All other systems reviewed and are negative.  Objective:   Today's Vitals: BP (!) 148/88 (BP Location: Right Arm, Patient Position: Sitting)   Pulse 88   Temp 98 F (36.7  C)   Ht 5\' 10"  (1.778 m)   Wt (!) 307 lb 0.6 oz (139.3 kg)   SpO2 94%   BMI 44.06 kg/m   Physical Exam Constitutional:      Appearance: He is obese.  HENT:     Head: Normocephalic and atraumatic.  Cardiovascular:     Rate and Rhythm: Normal rate and regular rhythm.     Pulses: Normal pulses.     Heart sounds: Normal heart sounds.  Pulmonary:     Effort: Pulmonary effort is normal.     Breath sounds: Normal breath sounds.  Abdominal:     General: Bowel sounds are normal.     Palpations: Abdomen is soft.  Musculoskeletal:        General: Normal range of motion.     Cervical back: Normal range of motion.   Skin:    General: Skin is warm and dry.  Neurological:     Mental Status: He is alert.    Assessment & Plan:   Problem List Items Addressed This Visit       Cardiovascular and Mediastinum   Essential hypertension Encouraged on going compliance with current medication regimen Encouraged home monitoring and recording BP <130/80 Eating a heart-healthy diet with less salt Encouraged regular physical activity  Recommend Weight loss    Relevant Medications   amLODipine (NORVASC) 5 MG tablet   Other Visit Diagnoses     Encounter to establish care    -  Primary Discussed male health maintenance; self exams of breast and testicles  and annual exams. Discussed prostate age related concerns Discussed general safety in vehicle and COVID Discussed regular hydration with water Discussed healthy diet and exercise and weight management Discussed sexual health  Discussed mental health Encouraged to call our office for an appointment with in ongoing concerns for questions.      Prediabetes     Consider home glucose monitoring Weight loss at least 5% of current body weight is can be achieved with lifestyle modification dietary changes and regular daily exercise Encourage blood pressure control goal <120/80 and maintaining total cholesterol <200 Follow-up every 3 to 6 months for reevaluation Education material provided        Outpatient Encounter Medications as of 02/05/2021  Medication Sig   [DISCONTINUED] amLODipine (NORVASC) 5 MG tablet Take 1 tablet (5 mg total) by mouth daily.   albuterol (VENTOLIN HFA) 108 (90 Base) MCG/ACT inhaler Inhale 2 puffs into the lungs every 6 (six) hours as needed.   amLODipine (NORVASC) 5 MG tablet Take 1 tablet (5 mg total) by mouth daily.   fluticasone furoate-vilanterol (BREO ELLIPTA) 100-25 MCG/INH AEPB Inhale 1 puff into the lungs daily. (Patient not taking: Reported on 02/05/2021)   [DISCONTINUED] acetaminophen (TYLENOL) 500 MG tablet Take  500-1,000 mg by mouth every 6 (six) hours as needed for mild pain or headache.   No facility-administered encounter medications on file as of 02/05/2021.    Follow-up: Return in about 6 weeks (around 03/19/2021) for Follow up HTN 03/21/2021 for fasting labs.   [id number], NP

## 2021-03-19 ENCOUNTER — Other Ambulatory Visit: Payer: Self-pay

## 2021-03-20 ENCOUNTER — Ambulatory Visit (INDEPENDENT_AMBULATORY_CARE_PROVIDER_SITE_OTHER): Payer: Self-pay | Admitting: Nurse Practitioner

## 2021-03-20 ENCOUNTER — Encounter: Payer: Self-pay | Admitting: Nurse Practitioner

## 2021-03-20 ENCOUNTER — Other Ambulatory Visit: Payer: Self-pay

## 2021-03-20 VITALS — BP 135/91 | HR 84 | Temp 97.7°F | Ht 70.0 in | Wt 317.0 lb

## 2021-03-20 DIAGNOSIS — Z13220 Encounter for screening for lipoid disorders: Secondary | ICD-10-CM

## 2021-03-20 DIAGNOSIS — I10 Essential (primary) hypertension: Secondary | ICD-10-CM

## 2021-03-20 DIAGNOSIS — R7303 Prediabetes: Secondary | ICD-10-CM

## 2021-03-20 NOTE — Patient Instructions (Signed)
Managing Your Hypertension Hypertension, also called high blood pressure, is when the force of the blood pressing against the walls of the arteries is too strong. Arteries are blood vessels that carry blood from your heart throughout your body. Hypertension forces the heart to work harder to pump blood and may cause the arteries tobecome narrow or stiff. Understanding blood pressure readings Your personal target blood pressure may vary depending on your medical conditions, your age, and other factors. A blood pressure reading includes a higher number over a lower number. Ideally, your blood pressure should be below 120/80. You should know that: The first, or top, number is called the systolic pressure. It is a measure of the pressure in your arteries as your heart beats. The second, or bottom number, is called the diastolic pressure. It is a measure of the pressure in your arteries as the heart relaxes. Blood pressure is classified into four stages. Based on your blood pressure reading, your health care provider may use the following stages to determine what type of treatment you need, if any. Systolic pressure and diastolicpressure are measured in a unit called mmHg. Normal Systolic pressure: below 120. Diastolic pressure: below 80. Elevated Systolic pressure: 120-129. Diastolic pressure: below 80. Hypertension stage 1 Systolic pressure: 130-139. Diastolic pressure: 80-89. Hypertension stage 2 Systolic pressure: 140 or above. Diastolic pressure: 90 or above. How can this condition affect me? Managing your hypertension is an important responsibility. Over time, hypertension can damage the arteries and decrease blood flow to important parts of the body, including the brain, heart, and kidneys. Having untreated or uncontrolled hypertension can lead to: A heart attack. A stroke. A weakened blood vessel (aneurysm). Heart failure. Kidney damage. Eye damage. Metabolic syndrome. Memory and  concentration problems. Vascular dementia. What actions can I take to manage this condition? Hypertension can be managed by making lifestyle changes and possibly by taking medicines. Your health care provider will help you make a plan to bring yourblood pressure within a normal range. Nutrition  Eat a diet that is high in fiber and potassium, and low in salt (sodium), added sugar, and fat. An example eating plan is called the Dietary Approaches to Stop Hypertension (DASH) diet. To eat this way: Eat plenty of fresh fruits and vegetables. Try to fill one-half of your plate at each meal with fruits and vegetables. Eat whole grains, such as whole-wheat pasta, brown rice, or whole-grain bread. Fill about one-fourth of your plate with whole grains. Eat low-fat dairy products. Avoid fatty cuts of meat, processed or cured meats, and poultry with skin. Fill about one-fourth of your plate with lean proteins such as fish, chicken without skin, beans, eggs, and tofu. Avoid pre-made and processed foods. These tend to be higher in sodium, added sugar, and fat. Reduce your daily sodium intake. Most people with hypertension should eat less than 1,500 mg of sodium a day.  Lifestyle  Work with your health care provider to maintain a healthy body weight or to lose weight. Ask what an ideal weight is for you. Get at least 30 minutes of exercise that causes your heart to beat faster (aerobic exercise) most days of the week. Activities may include walking, swimming, or biking. Include exercise to strengthen your muscles (resistance exercise), such as weight lifting, as part of your weekly exercise routine. Try to do these types of exercises for 30 minutes at least 3 days a week. Do not use any products that contain nicotine or tobacco, such as cigarettes, e-cigarettes, and chewing   tobacco. If you need help quitting, ask your health care provider. Control any long-term (chronic) conditions you have, such as high  cholesterol or diabetes. Identify your sources of stress and find ways to manage stress. This may include meditation, deep breathing, or making time for fun activities.  Alcohol use Do not drink alcohol if: Your health care provider tells you not to drink. You are pregnant, may be pregnant, or are planning to become pregnant. If you drink alcohol: Limit how much you use to: 0-1 drink a day for women. 0-2 drinks a day for men. Be aware of how much alcohol is in your drink. In the [place] one drink equals one 12 oz bottle of beer (355 mL), one 5 oz glass of wine (148 mL), or one 1 oz glass of hard liquor (44 mL). Medicines Your health care provider may prescribe medicine if lifestyle changes are not enough to get your blood pressure under control and if: Your systolic blood pressure is 130 or higher. Your diastolic blood pressure is 80 or higher. Take medicines only as told by your health care provider. Follow the directions carefully. Blood pressure medicines must be taken as told by your health care provider. The medicine does not work as well when you skip doses. Skippingdoses also puts you at risk for problems. Monitoring Before you monitor your blood pressure: Do not smoke, drink caffeinated beverages, or exercise within 30 minutes before taking a measurement. Use the bathroom and empty your bladder (urinate). Sit quietly for at least 5 minutes before taking measurements. Monitor your blood pressure at home as told by your health care provider. To do this: Sit with your back straight and supported. Place your feet flat on the floor. Do not cross your legs. Support your arm on a flat surface, such as a table. Make sure your upper arm is at heart level. Each time you measure, take two or three readings one minute apart and record the results. You may also need to have your blood pressure checked regularly by your healthcare provider. General information Talk with your health care  provider about your diet, exercise habits, and other lifestyle factors that may be contributing to hypertension. Review all the medicines you take with your health care provider because there may be side effects or interactions. Keep all visits as told by your health care provider. Your health care provider can help you create and adjust your plan for managing your high blood pressure. Where to find more information National Heart, Lung, and Blood Institute: www.nhlbi.nih.gov American Heart Association: www.heart.org Contact a health care provider if: You think you are having a reaction to medicines you have taken. You have repeated (recurrent) headaches. You feel dizzy. You have swelling in your ankles. You have trouble with your vision. Get help right away if: You develop a severe headache or confusion. You have unusual weakness or numbness, or you feel faint. You have severe pain in your chest or abdomen. You vomit repeatedly. You have trouble breathing. These symptoms may represent a serious problem that is an emergency. Do not wait to see if the symptoms will go away. Get medical help right away. Call your local emergency services (911 in the [place] Do not drive yourself to the hospital. Summary Hypertension is when the force of blood pumping through your arteries is too strong. If this condition is not controlled, it may put you at risk for serious complications. Your personal target blood pressure may vary depending on your medical conditions,   your age, and other factors. For most people, a normal blood pressure is less than 120/80. Hypertension is managed by lifestyle changes, medicines, or both. Lifestyle changes to help manage hypertension include losing weight, eating a healthy, low-sodium diet, exercising more, stopping smoking, and limiting alcohol. This information is not intended to replace advice given to you by your health care provider. Make sure you discuss any questions  you have with your healthcare provider. Document Revised: 07/28/2019 Document Reviewed: 05/23/2019 Elsevier Patient Education  2022 Elsevier Inc.  

## 2021-03-20 NOTE — Progress Notes (Signed)
[facility] Patient [facility] [address] [place], [place]  [address] Phone:  [phone]   Fax:  [phone]   Established Patient Office Visit  Subjective:  Patient ID: Darius Joyce, male    DOB: 12-Nov-1985  Age: 35 y.o. MRN: [id number]  CC:  Chief Complaint  Patient presents with   Follow-up    6 week follow up;     HPI Darius Joyce presents for follow up. He  has a past medical history of Hilar adenopathy (12/23/2020).   He is follow up for 6 weeks reevaluation on HTN. He is on amlodipine 5 mg daily recent treatment. He has not been monitoring BP. He has been working increased hours. He has gained several pounds over the last 6 weeks he denies this being related to swelling. He works 10 hours shifts 2nd-3rd and he reports eating at 5 am and going to bed. Denies headache, dizziness, visual changes, shortness of breath, dyspnea on exertion, chest pain, nausea, vomiting or any edema.    Past Medical History:  Diagnosis Date   Hilar adenopathy 12/23/2020    Past Surgical History:  Procedure Laterality Date   BRONCHIAL BIOPSY  12/24/2020   Procedure: BRONCHIAL BIOPSIES;  Surgeon: Charlott Holler, MD;  Location: [facility] ENDOSCOPY;  Service: Pulmonary;;   BRONCHIAL NEEDLE ASPIRATION BIOPSY  12/24/2020   Procedure: BRONCHIAL NEEDLE ASPIRATION BIOPSIES;  Surgeon: Charlott Holler, MD;  Location: [facility] ENDOSCOPY;  Service: Pulmonary;;   BRONCHIAL WASHINGS  12/24/2020   Procedure: BRONCHIAL WASHINGS;  Surgeon: Charlott Holler, MD;  Location: [facility] ENDOSCOPY;  Service: Pulmonary;;   ENDOBRONCHIAL ULTRASOUND N/A 12/24/2020   Procedure: ENDOBRONCHIAL ULTRASOUND;  Surgeon: Charlott Holler, MD;  Location: [facility] ENDOSCOPY;  Service: Pulmonary;  Laterality: N/A;   HEMOSTASIS CONTROL  12/24/2020   Procedure: HEMOSTASIS CONTROL;  Surgeon: Charlott Holler, MD;  Location: [facility] ENDOSCOPY;  Service: Pulmonary;;   VIDEO BRONCHOSCOPY N/A 12/24/2020   Procedure: VIDEO BRONCHOSCOPY WITH FLUORO;  Surgeon:  Charlott Holler, MD;  Location: [facility] ENDOSCOPY;  Service: Pulmonary;  Laterality: N/A;    Family History  Problem Relation Age of Onset   Hypertension Mother    Hypertension Father    Pulmonary disease Brother    Asthma Brother     Social History   Socioeconomic History   Marital status: Single    Spouse name: Not on file   Number of children: Not on file   Years of education: Not on file   Highest education level: Not on file  Occupational History   Not on file  Tobacco Use   Smoking status: Never   Smokeless tobacco: Never  Vaping Use   Vaping Use: Never used  Substance and Sexual Activity   Alcohol use: Yes    Comment: rarely   Drug use: Yes    Types: Marijuana    Comment: smokes daily   Sexual activity: Yes  Other Topics Concern   Not on file  Social History Narrative   Not on file   Social Determinants of Health   Financial Resource Strain: Not on file  Food Insecurity: Not on file  Transportation Needs: Not on file  Physical Activity: Not on file  Stress: Not on file  Social Connections: Not on file  Intimate Partner Violence: Not on file    Outpatient Medications Prior to Visit  Medication Sig Dispense Refill   albuterol (VENTOLIN HFA) 108 (90 Base) MCG/ACT inhaler Inhale 2 puffs into the lungs every 6 (six) hours  as needed. 18 g 5   amLODipine (NORVASC) 5 MG tablet Take 1 tablet (5 mg total) by mouth daily. 90 tablet 3   fluticasone furoate-vilanterol (BREO ELLIPTA) 100-25 MCG/INH AEPB Inhale 1 puff into the lungs daily. 28 each 0   No facility-administered medications prior to visit.    No Known Allergies  ROS Review of Systems    Objective:    Physical Exam Constitutional:      General: He is not in acute distress.    Appearance: He is obese. He is not ill-appearing, toxic-appearing or diaphoretic.  HENT:     Head: Normocephalic and atraumatic.  Cardiovascular:     Rate and Rhythm: Normal rate and regular rhythm.     Pulses: Normal  pulses.     Heart sounds: Normal heart sounds.  Pulmonary:     Effort: Pulmonary effort is normal.     Comments: diminished Musculoskeletal:        General: Normal range of motion.     Cervical back: Normal range of motion.     Right lower leg: No edema.     Left lower leg: No edema.  Skin:    General: Skin is warm.     Capillary Refill: Capillary refill takes less than 2 seconds.  Neurological:     General: No focal deficit present.     Mental Status: He is alert and oriented to person, place, and time.  Psychiatric:        Mood and Affect: Mood normal.        Behavior: Behavior normal.        Thought Content: Thought content normal.        Judgment: Judgment normal.    BP (!) 135/91 (BP Location: Right Arm, Patient Position: Sitting)   Pulse 84   Temp 97.7 F (36.5 C)   Ht 5\' 10"  (1.778 m)   Wt (!) 317 lb 0.6 oz (143.8 kg)   SpO2 99%   BMI 45.49 kg/m  Wt Readings from Last 3 Encounters:  03/20/21 (!) 317 lb 0.6 oz (143.8 kg)  02/05/21 (!) 307 lb 0.6 oz (139.3 kg)  01/13/21 (!) 308 lb 6.4 oz (139.9 kg)     There are no preventive care reminders to display for this patient.   There are no preventive care reminders to display for this patient.  Lab Results  Component Value Date   TSH 1.959 12/23/2020   Lab Results  Component Value Date   WBC 6.0 12/24/2020   HGB 13.3 12/24/2020   HCT 39.7 12/24/2020   MCV 83.9 12/24/2020   PLT 306 12/24/2020   Lab Results  Component Value Date   NA 138 12/24/2020   K 4.2 12/24/2020   CO2 27 12/24/2020   GLUCOSE 94 12/24/2020   BUN 8 12/24/2020   CREATININE 0.93 12/24/2020   BILITOT 0.5 12/23/2020   ALKPHOS 64 12/23/2020   AST 23 12/23/2020   ALT 22 12/23/2020   PROT 6.9 12/23/2020   ALBUMIN 3.0 (L) 12/23/2020   CALCIUM 9.2 12/24/2020   ANIONGAP 9 12/24/2020   No results found for: CHOL No results found for: HDL No results found for: LDLCALC No results found for: TRIG No results found for: CHOLHDL Lab  Results  Component Value Date   HGBA1C 5.9 (H) 12/22/2020      Assessment & Plan:   Problem List Items Addressed This Visit       Cardiovascular and Mediastinum   Essential hypertension - Primary Stable  Encouraged on going compliance with current medication regimen Encouraged home monitoring and recording BP <130/80 will call if not improving for increase in Amlodipine  Eating a heart-healthy diet with less salt Encouraged regular physical activity  Recommend Weight loss   Other Visit Diagnoses     Screening for cholesterol level       Relevant Orders   Lipid panel   Prediabetes     Consider home glucose monitoring Weight loss at least 5% of current body weight is can be achieved with lifestyle modification dietary changes and regular daily exercise Encourage blood pressure control goal <120/80 and maintaining total cholesterol <200 Follow-up every 3 to 6 months for reevaluation Education material provided        No orders of the defined types were placed in this encounter.   Follow-up: Return in about 3 months (around 06/19/2021) for Follow up HTN 77412.    Barbette Merino, NP

## 2021-03-21 ENCOUNTER — Encounter: Payer: Self-pay | Admitting: Nurse Practitioner

## 2021-03-21 LAB — LIPID PANEL
Chol/HDL Ratio: 4 ratio (ref 0.0–5.0)
Cholesterol, Total: 158 mg/dL (ref 100–199)
HDL: 40 mg/dL (ref >39)
LDL Chol Calc (NIH): 99 mg/dL (ref 0–99)
Triglycerides: 100 mg/dL (ref 0–149)
VLDL Cholesterol Cal: 19 mg/dL (ref 5–40)

## 2021-06-19 ENCOUNTER — Ambulatory Visit: Payer: Self-pay | Admitting: Nurse Practitioner

## 2021-06-25 ENCOUNTER — Ambulatory Visit: Payer: Self-pay | Admitting: Nurse Practitioner

## 2021-07-14 ENCOUNTER — Encounter: Payer: Self-pay | Admitting: Nurse Practitioner

## 2021-07-14 ENCOUNTER — Ambulatory Visit (INDEPENDENT_AMBULATORY_CARE_PROVIDER_SITE_OTHER): Payer: Self-pay | Admitting: Nurse Practitioner

## 2021-07-14 ENCOUNTER — Other Ambulatory Visit: Payer: Self-pay

## 2021-07-14 VITALS — BP 170/90 | HR 87 | Temp 98.2°F | Ht 70.0 in | Wt 354.6 lb

## 2021-07-14 DIAGNOSIS — I10 Essential (primary) hypertension: Secondary | ICD-10-CM

## 2021-07-14 DIAGNOSIS — R635 Abnormal weight gain: Secondary | ICD-10-CM

## 2021-07-14 DIAGNOSIS — D860 Sarcoidosis of lung: Secondary | ICD-10-CM

## 2021-07-14 DIAGNOSIS — I509 Heart failure, unspecified: Secondary | ICD-10-CM

## 2021-07-14 DIAGNOSIS — R7303 Prediabetes: Secondary | ICD-10-CM

## 2021-07-14 MED ORDER — AMLODIPINE BESYLATE 10 MG PO TABS: 10.0000 mg | ORAL_TABLET | Freq: Every day | ORAL | 1 refills | Status: DC

## 2021-07-14 NOTE — Patient Instructions (Signed)
Managing Your Hypertension Hypertension, also called high blood pressure, is when the force of the blood pressing against the walls of the arteries is too strong. Arteries are blood vessels that carry blood from your heart throughout your body. Hypertension forces the heart to work harder to pump blood and may cause the arteries to become narrow or stiff. Understanding blood pressure readings Your personal target blood pressure may vary depending on your medical conditions, your age, and other factors. A blood pressure reading includes a higher number over a lower number. Ideally, your blood pressure should be below 120/80. You should know that: The first, or top, number is called the systolic pressure. It is a measure of the pressure in your arteries as your heart beats. The second, or bottom number, is called the diastolic pressure. It is a measure of the pressure in your arteries as the heart relaxes. Blood pressure is classified into four stages. Based on your blood pressure reading, your health care provider may use the following stages to determine what type of treatment you need, if any. Systolic pressure and diastolic pressure are measured in a unit called mmHg. Normal Systolic pressure: below 120. Diastolic pressure: below 80. Elevated Systolic pressure: 120-129. Diastolic pressure: below 80. Hypertension stage 1 Systolic pressure: 130-139. Diastolic pressure: 80-89. Hypertension stage 2 Systolic pressure: 140 or above. Diastolic pressure: 90 or above. How can this condition affect me? Managing your hypertension is an important responsibility. Over time, hypertension can damage the arteries and decrease blood flow to important parts of the body, including the brain, heart, and kidneys. Having untreated or uncontrolled hypertension can lead to: A heart attack. A stroke. A weakened blood vessel (aneurysm). Heart failure. Kidney damage. Eye damage. Metabolic syndrome. Memory and  concentration problems. Vascular dementia. What actions can I take to manage this condition? Hypertension can be managed by making lifestyle changes and possibly by taking medicines. Your health care provider will help you make a plan to bring your blood pressure within a normal range. Nutrition  Eat a diet that is high in fiber and potassium, and low in salt (sodium), added sugar, and fat. An example eating plan is called the Dietary Approaches to Stop Hypertension (DASH) diet. To eat this way: Eat plenty of fresh fruits and vegetables. Try to fill one-half of your plate at each meal with fruits and vegetables. Eat whole grains, such as whole-wheat pasta, brown rice, or whole-grain bread. Fill about one-fourth of your plate with whole grains. Eat low-fat dairy products. Avoid fatty cuts of meat, processed or cured meats, and poultry with skin. Fill about one-fourth of your plate with lean proteins such as fish, chicken without skin, beans, eggs, and tofu. Avoid pre-made and processed foods. These tend to be higher in sodium, added sugar, and fat. Reduce your daily sodium intake. Most people with hypertension should eat less than 1,500 mg of sodium a day. Lifestyle  Work with your health care provider to maintain a healthy body weight or to lose weight. Ask what an ideal weight is for you. Get at least 30 minutes of exercise that causes your heart to beat faster (aerobic exercise) most days of the week. Activities may include walking, swimming, or biking. Include exercise to strengthen your muscles (resistance exercise), such as weight lifting, as part of your weekly exercise routine. Try to do these types of exercises for 30 minutes at least 3 days a week. Do not use any products that contain nicotine or tobacco, such as cigarettes, e-cigarettes,   and chewing tobacco. If you need help quitting, ask your health care provider. Control any long-term (chronic) conditions you have, such as high  cholesterol or diabetes. Identify your sources of stress and find ways to manage stress. This may include meditation, deep breathing, or making time for fun activities. Alcohol use Do not drink alcohol if: Your health care provider tells you not to drink. You are pregnant, may be pregnant, or are planning to become pregnant. If you drink alcohol: Limit how much you use to: 0-1 drink a day for women. 0-2 drinks a day for men. Be aware of how much alcohol is in your drink. In the [place] one drink equals one 12 oz bottle of beer (355 mL), one 5 oz glass of wine (148 mL), or one 1 oz glass of hard liquor (44 mL). Medicines Your health care provider may prescribe medicine if lifestyle changes are not enough to get your blood pressure under control and if: Your systolic blood pressure is 130 or higher. Your diastolic blood pressure is 80 or higher. Take medicines only as told by your health care provider. Follow the directions carefully. Blood pressure medicines must be taken as told by your health care provider. The medicine does not work as well when you skip doses. Skipping doses also puts you at risk for problems. Monitoring Before you monitor your blood pressure: Do not smoke, drink caffeinated beverages, or exercise within 30 minutes before taking a measurement. Use the bathroom and empty your bladder (urinate). Sit quietly for at least 5 minutes before taking measurements. Monitor your blood pressure at home as told by your health care provider. To do this: Sit with your back straight and supported. Place your feet flat on the floor. Do not cross your legs. Support your arm on a flat surface, such as a table. Make sure your upper arm is at heart level. Each time you measure, take two or three readings one minute apart and record the results. You may also need to have your blood pressure checked regularly by your health care provider. General information Talk with your health care  provider about your diet, exercise habits, and other lifestyle factors that may be contributing to hypertension. Review all the medicines you take with your health care provider because there may be side effects or interactions. Keep all visits as told by your health care provider. Your health care provider can help you create and adjust your plan for managing your high blood pressure. Where to find more information National Heart, Lung, and Blood Institute: www.nhlbi.nih.gov American Heart Association: www.heart.org Contact a health care provider if: You think you are having a reaction to medicines you have taken. You have repeated (recurrent) headaches. You feel dizzy. You have swelling in your ankles. You have trouble with your vision. Get help right away if: You develop a severe headache or confusion. You have unusual weakness or numbness, or you feel faint. You have severe pain in your chest or abdomen. You vomit repeatedly. You have trouble breathing. These symptoms may represent a serious problem that is an emergency. Do not wait to see if the symptoms will go away. Get medical help right away. Call your local emergency services (911 in the [place] Do not drive yourself to the hospital. Summary Hypertension is when the force of blood pumping through your arteries is too strong. If this condition is not controlled, it may put you at risk for serious complications. Your personal target blood pressure may vary depending on   your medical conditions, your age, and other factors. For most people, a normal blood pressure is less than 120/80. Hypertension is managed by lifestyle changes, medicines, or both. Lifestyle changes to help manage hypertension include losing weight, eating a healthy, low-sodium diet, exercising more, stopping smoking, and limiting alcohol. This information is not intended to replace advice given to you by your health care provider. Make sure you discuss any questions  you have with your health care provider. Document Revised: 07/10/2019 Document Reviewed: 05/23/2019 Elsevier Patient Education  2022 Elsevier Inc.  

## 2021-07-14 NOTE — Progress Notes (Signed)
[facility] [address] [place], [place]  [address] Phone:  [phone]   Fax:  [phone]   Established Patient Office Visit  Subjective:  Patient ID: Darius MinorsCornelius Ferris, male    DOB: 08/03/1985  Age: 36 y.o. MRN: [id number]  CC:  Chief Complaint  Patient presents with   Follow-up    Pt is here today for his 3 month follow up visit No concerns or issues to discuss today.    HPI Darius MinorsCornelius Arciga presents for follow up. He  has a past medical history of Hilar adenopathy (12/23/2020).   He is in today for follow up for his HTN. He is prescribed Amlodipine 5 mg daily. He is concern about his 34 pound weight gain since his smoking cessation. He has a diagnoses of sarcoidosis. He reports not having to using the inhaler.  He felt like the week of Christmas he had some wine and experienced some swelling in his legs.  Denies headache, dizziness, visual changes, shortness of breath, dyspnea on exertion, chest pain, nausea, vomiting.  Past Medical History:  Diagnosis Date   Hilar adenopathy 12/23/2020    Past Surgical History:  Procedure Laterality Date   BRONCHIAL BIOPSY  12/24/2020   Procedure: BRONCHIAL BIOPSIES;  Surgeon: Charlott Holleresai, Nikita S, MD;  [facility];  Service: Pulmonary;;   BRONCHIAL NEEDLE ASPIRATION BIOPSY  12/24/2020   Procedure: BRONCHIAL NEEDLE ASPIRATION BIOPSIES;  Surgeon: Charlott Holleresai, Nikita S, MD;  [facility];  Service: Pulmonary;;   BRONCHIAL WASHINGS  12/24/2020   Procedure: BRONCHIAL WASHINGS;  Surgeon: Charlott Holleresai, Nikita S, MD;  [facility];  Service: Pulmonary;;   ENDOBRONCHIAL ULTRASOUND N/A 12/24/2020   Procedure: ENDOBRONCHIAL ULTRASOUND;  Surgeon: Charlott Holleresai, Nikita S, MD;  [facility];  Service: Pulmonary;  Laterality: N/A;   HEMOSTASIS CONTROL  12/24/2020   Procedure: HEMOSTASIS CONTROL;  Surgeon: Charlott Holleresai, Nikita S, MD;  [facility];  Service: Pulmonary;;   VIDEO BRONCHOSCOPY N/A 12/24/2020    Procedure: VIDEO BRONCHOSCOPY WITH FLUORO;  Surgeon: Charlott Holleresai, Nikita S, MD;  [facility];  Service: Pulmonary;  Laterality: N/A;    Family History  Problem Relation Age of Onset   Hypertension Mother    Hypertension Father    Pulmonary disease Brother    Asthma Brother     Social History   Socioeconomic History   Marital status: Single    Spouse name: Not on file   Number of children: Not on file   Years of education: Not on file   Highest education level: Not on file  Occupational History   Not on file  Tobacco Use   Smoking status: Never   Smokeless tobacco: Never  Vaping Use   Vaping Use: Never used  Substance and Sexual Activity   Alcohol use: Yes    Alcohol/week: 1.0 standard drink    Types: 1 Glasses of wine per week    Comment: rarely   Drug use: Not Currently    Types: Marijuana    Comment: smokes daily   Sexual activity: Yes  Other Topics Concern   Not on file  Social History Narrative   Not on file   Social Determinants of Health   Financial Resource Strain: Not on file  Food Insecurity: Not on file  Transportation Needs: Not on file  Physical Activity: Not on file  Stress: Not on file  Social Connections: Not on file  Intimate Partner Violence: Not on file    Outpatient Medications Prior to Visit  Medication Sig Dispense Refill   albuterol (VENTOLIN HFA) 108 (90 Base) MCG/ACT inhaler Inhale 2 puffs into the lungs every 6 (six) hours as needed. 18 g 5   amLODipine (NORVASC) 5 MG tablet Take 1 tablet (5 mg total) by mouth daily. 90 tablet 3   fluticasone furoate-vilanterol (BREO ELLIPTA) 100-25 MCG/INH AEPB Inhale 1 puff into the lungs daily. (Patient not taking: Reported on 07/14/2021) 28 each 0   No facility-administered medications prior to visit.    No Known Allergies  ROS Review of Systems    Objective:    Physical Exam Constitutional:      Appearance: He is obese.  HENT:     Head: Normocephalic and atraumatic.     Nose:  Nose normal.     Mouth/Throat:     Mouth: Mucous membranes are moist.  Cardiovascular:     Rate and Rhythm: Normal rate and regular rhythm.     Pulses: Normal pulses.     Heart sounds: Normal heart sounds.  Pulmonary:     Effort: Pulmonary effort is normal.     Breath sounds: Normal breath sounds.  Musculoskeletal:        General: Normal range of motion.     Cervical back: Normal range of motion.  Skin:    General: Skin is warm and dry.     Capillary Refill: Capillary refill takes less than 2 seconds.  Neurological:     General: No focal deficit present.     Mental Status: He is alert and oriented to person, place, and time.  Psychiatric:        Mood and Affect: Mood normal.        Behavior: Behavior normal.        Thought Content: Thought content normal.        Judgment: Judgment normal.    BP (!) 170/90 (BP Location: Left Arm, Cuff Size: Large)    Pulse 87    Temp 98.2 F (36.8 C)    Ht 5\' 10"  (1.778 m)    Wt (!) 354 lb 9.6 oz (160.8 kg)    SpO2 96%    BMI 50.88 kg/m  Wt Readings from Last 3 Encounters:  07/14/21 (!) 354 lb 9.6 oz (160.8 kg)  03/20/21 (!) 317 lb 0.6 oz (143.8 kg)  02/05/21 (!) 307 lb 0.6 oz (139.3 kg)     Health Maintenance Due  Topic Date Due   COVID-19 Vaccine (1) Never done   Pneumococcal Vaccine 72-43 Years old (1 - PCV) Never done    There are no preventive care reminders to display for this patient.  Lab Results  Component Value Date   TSH 1.959 12/23/2020   Lab Results  Component Value Date   WBC 6.0 12/24/2020   HGB 13.3 12/24/2020   HCT 39.7 12/24/2020   MCV 83.9 12/24/2020   PLT 306 12/24/2020   Lab Results  Component Value Date   NA 138 12/24/2020   K 4.2 12/24/2020   CO2 27 12/24/2020   GLUCOSE 94 12/24/2020   BUN 8 12/24/2020   CREATININE 0.93 12/24/2020   BILITOT 0.5 12/23/2020   ALKPHOS 64 12/23/2020   AST 23 12/23/2020   ALT 22 12/23/2020   PROT 6.9 12/23/2020   ALBUMIN 3.0 (L) 12/23/2020   CALCIUM 9.2 12/24/2020    ANIONGAP 9 12/24/2020   Lab Results  Component Value Date   CHOL 158 03/20/2021   Lab Results  Component Value Date   HDL 40 03/20/2021  Lab Results  Component Value Date   LDLCALC 99 03/20/2021   Lab Results  Component Value Date   TRIG 100 03/20/2021   Lab Results  Component Value Date   CHOLHDL 4.0 03/20/2021   Lab Results  Component Value Date   HGBA1C 5.9 (H) 12/22/2020      Assessment & Plan:   Problem List Items Addressed This Visit       Cardiovascular and Mediastinum   Essential hypertension Worsening  Increased Amlodipine 10 mg daily  Encouraged on going compliance with current medication regimen Encouraged home monitoring and recording BP <130/80 Eating a heart-healthy diet with less salt Encouraged regular physical activity  Recommend Weight loss     Relevant Medications   amLODipine (NORVASC) 10 MG tablet   Other Relevant Orders   Comp. Metabolic Panel (12)   Congestive heart failure, unspecified HF chronicity, unspecified heart failure type (HCC) Stable  Discussed diet  Sodium intake   Relevant Medications   amLODipine (NORVASC) 10 MG tablet     Respiratory   Pulmonary sarcoidosis (HCC) Stable  Discussed pulmonology in the future for follow up.    Other Visit Diagnoses     Weight gain    -  Primary Evaluation    Relevant Orders   TSH   Prediabetes Stable current A1c 5.8% Consider home glucose monitoring Weight loss at least 5% of current body weight is can be achieved with lifestyle modification dietary changes and regular daily exercise Encourage blood pressure control goal <120/80 and maintaining total cholesterol <200 Follow-up every 3 to 6 months for reevaluation Education material provided     Meds ordered this encounter  Medications   amLODipine (NORVASC) 10 MG tablet    Sig: Take 1 tablet (10 mg total) by mouth daily.    Dispense:  90 tablet    Refill:  1    Order Specific Question:   Supervising Provider     Answer:   Quentin Angst [id number]    Follow-up: Return in about 6 weeks (around 08/25/2021) for Follow up HTN [id number].    Barbette Merino, NP

## 2021-07-15 LAB — COMP. METABOLIC PANEL (12)
AST: 27 IU/L (ref 0–40)
Albumin/Globulin Ratio: 1.1 — ABNORMAL LOW (ref 1.2–2.2)
Albumin: 4 g/dL (ref 4.0–5.0)
Alkaline Phosphatase: 93 IU/L (ref 44–121)
BUN/Creatinine Ratio: 8 — ABNORMAL LOW (ref 9–20)
BUN: 8 mg/dL (ref 6–20)
Bilirubin Total: 0.3 mg/dL (ref 0.0–1.2)
Calcium: 9.4 mg/dL (ref 8.7–10.2)
Chloride: 103 mmol/L (ref 96–106)
Creatinine, Ser: 1.02 mg/dL (ref 0.76–1.27)
Globulin, Total: 3.5 g/dL (ref 1.5–4.5)
Glucose: 97 mg/dL (ref 70–99)
Potassium: 4.4 mmol/L (ref 3.5–5.2)
Sodium: 141 mmol/L (ref 134–144)
Total Protein: 7.5 g/dL (ref 6.0–8.5)
eGFR: 98 mL/min/{1.73_m2} (ref >59)

## 2021-07-15 LAB — TSH: TSH: 2.68 u[IU]/mL (ref 0.450–4.500)

## 2021-07-16 LAB — POCT GLYCOSYLATED HEMOGLOBIN (HGB A1C)
HbA1c POC (<> result, manual entry): 5.8 % (ref 4.0–5.6)
HbA1c, POC (controlled diabetic range): 5.8 % (ref 0.0–7.0)
HbA1c, POC (prediabetic range): 5.8 % (ref 5.7–6.4)
Hemoglobin A1C: 5.8 % — AB (ref 4.0–5.6)

## 2021-07-16 LAB — GLUCOSE, POCT (MANUAL RESULT ENTRY): POC Glucose: 88 mg/dl (ref 70–99)

## 2021-08-27 ENCOUNTER — Ambulatory Visit (INDEPENDENT_AMBULATORY_CARE_PROVIDER_SITE_OTHER): Payer: Self-pay | Admitting: Nurse Practitioner

## 2021-08-27 ENCOUNTER — Other Ambulatory Visit: Payer: Self-pay

## 2021-08-27 ENCOUNTER — Encounter: Payer: Self-pay | Admitting: Nurse Practitioner

## 2021-08-27 VITALS — BP 141/74 | HR 89 | Temp 98.1°F | Ht 70.0 in | Wt 355.0 lb

## 2021-08-27 DIAGNOSIS — I10 Essential (primary) hypertension: Secondary | ICD-10-CM

## 2021-08-27 NOTE — Patient Instructions (Signed)
Managing Your Hypertension Hypertension, also called high blood pressure, is when the force of the blood pressing against the walls of the arteries is too strong. Arteries are blood vessels that carry blood from your heart throughout your body. Hypertension forces the heart to work harder to pump blood and may cause the arteries to become narrow or stiff. Understanding blood pressure readings Your personal target blood pressure may vary depending on your medical conditions, your age, and other factors. A blood pressure reading includes a higher number over a lower number. Ideally, your blood pressure should be below 120/80. You should know that: The first, or top, number is called the systolic pressure. It is a measure of the pressure in your arteries as your heart beats. The second, or bottom number, is called the diastolic pressure. It is a measure of the pressure in your arteries as the heart relaxes. Blood pressure is classified into four stages. Based on your blood pressure reading, your health care provider may use the following stages to determine what type of treatment you need, if any. Systolic pressure and diastolic pressure are measured in a unit called mmHg. Normal Systolic pressure: below 120. Diastolic pressure: below 80. Elevated Systolic pressure: 120-129. Diastolic pressure: below 80. Hypertension stage 1 Systolic pressure: 130-139. Diastolic pressure: 80-89. Hypertension stage 2 Systolic pressure: 140 or above. Diastolic pressure: 90 or above. How can this condition affect me? Managing your hypertension is an important responsibility. Over time, hypertension can damage the arteries and decrease blood flow to important parts of the body, including the brain, heart, and kidneys. Having untreated or uncontrolled hypertension can lead to: A heart attack. A stroke. A weakened blood vessel (aneurysm). Heart failure. Kidney damage. Eye damage. Metabolic syndrome. Memory and  concentration problems. Vascular dementia. What actions can I take to manage this condition? Hypertension can be managed by making lifestyle changes and possibly by taking medicines. Your health care provider will help you make a plan to bring your blood pressure within a normal range. Nutrition  Eat a diet that is high in fiber and potassium, and low in salt (sodium), added sugar, and fat. An example eating plan is called the Dietary Approaches to Stop Hypertension (DASH) diet. To eat this way: Eat plenty of fresh fruits and vegetables. Try to fill one-half of your plate at each meal with fruits and vegetables. Eat whole grains, such as whole-wheat pasta, brown rice, or whole-grain bread. Fill about one-fourth of your plate with whole grains. Eat low-fat dairy products. Avoid fatty cuts of meat, processed or cured meats, and poultry with skin. Fill about one-fourth of your plate with lean proteins such as fish, chicken without skin, beans, eggs, and tofu. Avoid pre-made and processed foods. These tend to be higher in sodium, added sugar, and fat. Reduce your daily sodium intake. Most people with hypertension should eat less than 1,500 mg of sodium a day. Lifestyle  Work with your health care provider to maintain a healthy body weight or to lose weight. Ask what an ideal weight is for you. Get at least 30 minutes of exercise that causes your heart to beat faster (aerobic exercise) most days of the week. Activities may include walking, swimming, or biking. Include exercise to strengthen your muscles (resistance exercise), such as weight lifting, as part of your weekly exercise routine. Try to do these types of exercises for 30 minutes at least 3 days a week. Do not use any products that contain nicotine or tobacco, such as cigarettes, e-cigarettes,   and chewing tobacco. If you need help quitting, ask your health care provider. Control any long-term (chronic) conditions you have, such as high  cholesterol or diabetes. Identify your sources of stress and find ways to manage stress. This may include meditation, deep breathing, or making time for fun activities. Alcohol use Do not drink alcohol if: Your health care provider tells you not to drink. You are pregnant, may be pregnant, or are planning to become pregnant. If you drink alcohol: Limit how much you use to: 0-1 drink a day for women. 0-2 drinks a day for men. Be aware of how much alcohol is in your drink. In the [place] one drink equals one 12 oz bottle of beer (355 mL), one 5 oz glass of wine (148 mL), or one 1 oz glass of hard liquor (44 mL). Medicines Your health care provider may prescribe medicine if lifestyle changes are not enough to get your blood pressure under control and if: Your systolic blood pressure is 130 or higher. Your diastolic blood pressure is 80 or higher. Take medicines only as told by your health care provider. Follow the directions carefully. Blood pressure medicines must be taken as told by your health care provider. The medicine does not work as well when you skip doses. Skipping doses also puts you at risk for problems. Monitoring Before you monitor your blood pressure: Do not smoke, drink caffeinated beverages, or exercise within 30 minutes before taking a measurement. Use the bathroom and empty your bladder (urinate). Sit quietly for at least 5 minutes before taking measurements. Monitor your blood pressure at home as told by your health care provider. To do this: Sit with your back straight and supported. Place your feet flat on the floor. Do not cross your legs. Support your arm on a flat surface, such as a table. Make sure your upper arm is at heart level. Each time you measure, take two or three readings one minute apart and record the results. You may also need to have your blood pressure checked regularly by your health care provider. General information Talk with your health care  provider about your diet, exercise habits, and other lifestyle factors that may be contributing to hypertension. Review all the medicines you take with your health care provider because there may be side effects or interactions. Keep all visits as told by your health care provider. Your health care provider can help you create and adjust your plan for managing your high blood pressure. Where to find more information National Heart, Lung, and Blood Institute: www.nhlbi.nih.gov American Heart Association: www.heart.org Contact a health care provider if: You think you are having a reaction to medicines you have taken. You have repeated (recurrent) headaches. You feel dizzy. You have swelling in your ankles. You have trouble with your vision. Get help right away if: You develop a severe headache or confusion. You have unusual weakness or numbness, or you feel faint. You have severe pain in your chest or abdomen. You vomit repeatedly. You have trouble breathing. These symptoms may represent a serious problem that is an emergency. Do not wait to see if the symptoms will go away. Get medical help right away. Call your local emergency services (911 in the [place] Do not drive yourself to the hospital. Summary Hypertension is when the force of blood pumping through your arteries is too strong. If this condition is not controlled, it may put you at risk for serious complications. Your personal target blood pressure may vary depending on   your medical conditions, your age, and other factors. For most people, a normal blood pressure is less than 120/80. Hypertension is managed by lifestyle changes, medicines, or both. Lifestyle changes to help manage hypertension include losing weight, eating a healthy, low-sodium diet, exercising more, stopping smoking, and limiting alcohol. This information is not intended to replace advice given to you by your health care provider. Make sure you discuss any questions  you have with your health care provider. Document Revised: 07/10/2019 Document Reviewed: 05/23/2019 Elsevier Patient Education  2022 Elsevier Inc.  

## 2021-08-27 NOTE — Progress Notes (Signed)
[place], [place]  [address] Phone:  [phone]   Fax:  [phone]   Established Patient Office Visit  Subjective:  Patient ID: Darius Joyce, male    DOB: Jun 22, 1986  Age: 36 y.o. MRN: [id number]  CC:  Chief Complaint  Patient presents with   Follow-up    Pt is here for 6 weeks follow up. No issues or concerns    HPI Darius Joyce presents for follow up. He  has a past medical history of Hilar adenopathy (12/23/2020).   Darius Joyce is in today for follow up for Hypertension. The current prescribed treatment is amlodpine 10 mg .Compliance is reported and home blood pressure monitoring is done with a range is okay. The  DASH diet is being followed. An exercise regimen is ongoing. He is working with a Clinical research associate. There is a goal to get back in the 200's with his weight . Denies headache, dizziness, visual changes, shortness of breath, dyspnea on exertion, chest pain, nausea, vomiting or any edema.    Past Medical History:  Diagnosis Date   Hilar adenopathy 12/23/2020    Past Surgical History:  Procedure Laterality Date   BRONCHIAL BIOPSY  12/24/2020   Procedure: BRONCHIAL BIOPSIES;  Surgeon: Spero Geralds, MD;  [facility];  Service: Pulmonary;;   BRONCHIAL NEEDLE ASPIRATION BIOPSY  12/24/2020   Procedure: BRONCHIAL NEEDLE ASPIRATION BIOPSIES;  Surgeon: Spero Geralds, MD;  Location: [place];  Service: Pulmonary;;   BRONCHIAL WASHINGS  12/24/2020   Procedure: BRONCHIAL WASHINGS;  Surgeon: Spero Geralds, MD;  Location[facility] ENDOSCOPY;  Service: Pulmonary;;   ENDOBRONCHIAL ULTRASOUND N/A 12/24/2020   Procedure: ENDOBRONCHIAL ULTRASOUND;  Surgeon: Spero Geralds, MD;  Location: [facility] ENDOSCOPY;  Service: Pulmonary;  Laterality: N/A;   HEMOSTASIS CONTROL  12/24/2020   Procedure: HEMOSTASIS CONTROL;  Surgeon: Spero Geralds, MD;  Location[facility] ENDOSCOPY;  Service: Pulmonary;;   VIDEO BRONCHOSCOPY N/A 12/24/2020    Procedure: VIDEO BRONCHOSCOPY WITH FLUORO;  Surgeon: Spero Geralds, MD;  Location: [facility] ENDOSCOPY;  Service: Pulmonary;  Laterality: N/A;    Family History  Problem Relation Age of Onset   Hypertension Mother    Hypertension Father    Pulmonary disease Brother    Asthma Brother     Social History   Socioeconomic History   Marital status: Single    Spouse name: Not on file   Number of children: Not on file   Years of education: Not on file   Highest education level: Not on file  Occupational History   Not on file  Tobacco Use   Smoking status: Never   Smokeless tobacco: Never  Vaping Use   Vaping Use: Never used  Substance and Sexual Activity   Alcohol use: Yes    Alcohol/week: 1.0 standard drink    Types: 1 Glasses of wine per week    Comment: rarely   Drug use: Not Currently    Types: Marijuana    Comment: smokes daily   Sexual activity: Yes  Other Topics Concern   Not on file  Social History Narrative   Not on file   Social Determinants of Health   Financial Resource Strain: Not on file  Food Insecurity: Not on file  Transportation Needs: Not on file  Physical Activity: Not on file  Stress: Not on file  Social Connections: Not on file  Intimate Partner Violence: Not on file    Outpatient Medications Prior to Visit  Medication Sig Dispense Refill   amLODipine (NORVASC) 10 MG tablet Take 1 tablet (10 mg total) by mouth daily. 90 tablet 1   albuterol (VENTOLIN HFA) 108 (90 Base) MCG/ACT inhaler Inhale 2 puffs into the lungs every 6 (six) hours as needed. (Patient not taking: Reported on 08/27/2021) 18 g 5   fluticasone furoate-vilanterol (BREO ELLIPTA) 100-25 MCG/INH AEPB Inhale 1 puff into the lungs daily. (Patient not taking: Reported on 07/14/2021) 28 each 0   No facility-administered medications prior to visit.    No Known Allergies  ROS Review of Systems    Objective:    Physical Exam Constitutional:      Appearance: He is obese.  HENT:      Head: Normocephalic and atraumatic.     Nose: Nose normal.     Mouth/Throat:     Mouth: Mucous membranes are moist.  Cardiovascular:     Rate and Rhythm: Normal rate and regular rhythm.     Pulses: Normal pulses.     Heart sounds: Normal heart sounds.  Pulmonary:     Effort: Pulmonary effort is normal.     Breath sounds: Normal breath sounds.  Musculoskeletal:        General: Normal range of motion.     Cervical back: Normal range of motion.  Skin:    General: Skin is warm and dry.     Capillary Refill: Capillary refill takes less than 2 seconds.  Neurological:     General: No focal deficit present.     Mental Status: He is alert and oriented to person, place, and time.  Psychiatric:        Mood and Affect: Mood normal.        Behavior: Behavior normal.        Thought Content: Thought content normal.        Judgment: Judgment normal.    BP (!) 141/74 (BP Location: Left Arm, Patient Position: Sitting, Cuff Size: Large)    Pulse 89    Temp 98.1 F (36.7 C)    Ht _0  (1.778 m)    Wt (!) 355 lb 0.6 oz (161 kg)    SpO2 100%    BMI 50.94 kg/m  Wt Readings from Last 3 Encounters:  08/27/21 (!) 355 lb 0.6 oz (161 kg)  07/14/21 (!) 354 lb 9.6 oz (160.8 kg)  03/20/21 (!) 317 lb 0.6 oz (143.8 kg)     Health Maintenance Due  Topic Date Due   COVID-19 Vaccine (1) Never done    There are no preventive care reminders to display for this patient.  Lab Results  Component Value Date   TSH 2.680 07/14/2021   Lab Results  Component Value Date   WBC 6.0 12/24/2020   HGB 13.3 12/24/2020   HCT 39.7 12/24/2020   MCV 83.9 12/24/2020   PLT 306 12/24/2020   Lab Results  Component Value Date   NA 141 07/14/2021   K 4.4 07/14/2021   CO2 27 12/24/2020   GLUCOSE 97 07/14/2021   BUN 8 07/14/2021   CREATININE 1.02 07/14/2021   BILITOT 0.3 07/14/2021   ALKPHOS 93 07/14/2021   AST 27 07/14/2021   ALT 22 12/23/2020   PROT 7.5 07/14/2021   ALBUMIN 4.0 07/14/2021   CALCIUM 9.4  07/14/2021   ANIONGAP 9 12/24/2020   EGFR 98 07/14/2021   Lab Results  Component Value Date   CHOL 158 03/20/2021   Lab Results  Component Value Date   HDL 40 03/20/2021  Lab Results  Component Value Date   LDLCALC 99 03/20/2021   Lab Results  Component Value Date   TRIG 100 03/20/2021   Lab Results  Component Value Date   CHOLHDL 4.0 03/20/2021   Lab Results  Component Value Date   HGBA1C 5.8 (A) 07/16/2021   HGBA1C 5.8 07/16/2021   HGBA1C 5.8 07/16/2021   HGBA1C 5.8 07/16/2021      Assessment & Plan:   Problem List Items Addressed This Visit       Cardiovascular and Mediastinum   Essential hypertension - Primary Improving Encouraged on going compliance with current medication regimen Encouraged home monitoring and recording BP <130/80 Eating a heart-healthy diet with less salt Encouraged regular physical activity  Recommend Weight loss    No orders of the defined types were placed in this encounter.   Follow-up: Return in about 2 months (around 10/25/2021).    Vevelyn Francois, NP

## 2021-09-15 ENCOUNTER — Ambulatory Visit (HOSPITAL_COMMUNITY)
Admission: EM | Admit: 2021-09-15 | Discharge: 2021-09-15 | Disposition: A | Discharge status: Home / Self Care | Payer: Self-pay | Attending: Physician Assistant | Admitting: Physician Assistant

## 2021-09-15 ENCOUNTER — Other Ambulatory Visit: Payer: Self-pay

## 2021-09-15 ENCOUNTER — Encounter (HOSPITAL_COMMUNITY): Payer: Self-pay

## 2021-09-15 DIAGNOSIS — M5442 Lumbago with sciatica, left side: Secondary | ICD-10-CM

## 2021-09-15 MED ORDER — CYCLOBENZAPRINE HCL 5 MG PO TABS: 10.0000 mg | ORAL_TABLET | Freq: Three times a day (TID) | ORAL | 0 refills | Status: AC | PRN

## 2021-09-15 NOTE — Discharge Instructions (Addendum)
Recommend 600 mg Ibuprofen every 8 hours as needed ?Recommend ice to affected area, light stretching, light walking ?Take flexeril as needed for muscle spasm ?Return if symptoms become worse.  ?

## 2021-09-15 NOTE — ED Triage Notes (Signed)
Onset this morning upon waking of low back pain. No meds taken.  ?No falls or injuries noted. Denies n/t in BLE. Pt notes that at the onset sxs were radiating from his low back into his bilateral buttocks and extending into the posterior aspect of his thighs before stopping above the knee. Bending aggravates sxs. ?

## 2021-09-15 NOTE — ED Provider Notes (Signed)
?[facility] ? ? ? ?CSN: [id number] ?Arrival date & time: 09/15/21  1542 ? ? ?  ? ?History   ?Chief Complaint ?Chief Complaint  ?Patient presents with  ? Back Pain  ?  lumbar  ? ? ?HPI ?Darius Joyce is a 36 y.o. male.  ? ?Pt complains of lower back pain that started earlier today upon waking. He denies injury or trauma.  He reports temporary radiation of pain to bilateral buttocks.  Denies numbness tingling, saddle anesthesia, loss of bowel or bladder problems.  He is ambulating without difficulty.  He has taken nothing for the symptoms. Bending makes the pain worse.  ? ? ?Past Medical History:  ?Diagnosis Date  ? Hilar adenopathy 12/23/2020  ? ? ?Patient Active Problem List  ? Diagnosis Date Noted  ? Congestive heart failure, unspecified HF chronicity, unspecified heart failure type (HCC) 07/14/2021  ? 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 Essential hypertension   ? Elective surgery   ? Vitamin D deficiency   ? Lung mass 12/23/2020  ? Pleuritic chest pain 12/23/2020  ? Marijuana smoker, continuous 12/23/2020  ? Mediastinal adenopathy 12/23/2020  ? Hilar adenopathy 12/23/2020  ? Pulmonary consolidation determined by examination (HCC) 12/23/2020  ? Dyspnea 12/22/2020  ? ? ?Past Surgical History:  ?Procedure Laterality Date  ? BRONCHIAL BIOPSY  12/24/2020  ? Procedure: BRONCHIAL BIOPSIES;  Surgeon: Charlott Holler, MD;  [facility];  Service: Pulmonary;;  ? BRONCHIAL NEEDLE ASPIRATION BIOPSY  12/24/2020  ? Procedure: BRONCHIAL NEEDLE ASPIRATION BIOPSIES;  Surgeon: Charlott Holler, MD;  [facility] ENDOSCOPY;  Service: Pulmonary;;  ? BRONCHIAL WASHINGS  12/24/2020  ? Procedure: BRONCHIAL WASHINGS;  Surgeon: Charlott Holler, MD;  Location: [facility] ENDOSCOPY;  Service: Pulmonary;;  ? ENDOBRONCHIAL ULTRASOUND N/A 12/24/2020  ? Procedure: ENDOBRONCHIAL ULTRASOUND;  Surgeon: Charlott Holler, MD;  Location: [facility] ENDOSCOPY;  Service: Pulmonary;  Laterality: N/A;  ? HEMOSTASIS CONTROL  12/24/2020  ? Procedure:  HEMOSTASIS CONTROL;  Surgeon: Charlott Holler, MD;  [facility]COPY;  Service: Pulmonary;;  ? VIDEO BRONCHOSCOPY N/A 12/24/2020  ? Procedure: VIDEO BRONCHOSCOPY WITH FLUORO;  Surgeon: Charlott Holler, MD;  Location: [facility] ENDOSCOPY;  Service: Pulmonary;  Laterality: N/A;  ? ? ? ? ? ?Home Medications   ? ?Prior to Admission medications   ?Medication Sig Start Date End Date Taking? Authorizing Provider  ?cyclobenzaprine (FLEXERIL) 5 MG tablet Take 2 tablets (10 mg total) by mouth 3 (three) times daily as needed for up to 7 days for muscle spasms. 09/15/21 09/22/21 Yes Ward, Tylene Fantasia, PA-C  ?albuterol (VENTOLIN HFA) 108 (90 Base) MCG/ACT inhaler Inhale 2 puffs into the lungs every 6 (six) hours as needed. ?Patient not taking: Reported on 08/27/2021 01/13/21   Charlott Holler, MD  ?amLODipine (NORVASC) 10 MG tablet Take 1 tablet (10 mg total) by mouth daily. 07/14/21 01/10/22  Barbette Merino, NP  ?fluticasone furoate-vilanterol (BREO ELLIPTA) 100-25 MCG/INH AEPB Inhale 1 puff into the lungs daily. ?Patient not taking: Reported on 07/14/2021 01/13/21   Charlott Holler, MD  ? ? ?Family History ?Family History  ?Problem Relation Age of Onset  ? Hypertension Mother   ? Hypertension Father   ? Pulmonary disease Brother   ? Asthma Brother   ? ? ?Social History ?Social History  ? ?Tobacco Use  ? Smoking status: Never  ? Smokeless tobacco: Never  ?Vaping Use  ? Vaping Use: Never used  ?Substance Use Topics  ? Alcohol use: Yes  ?  Alcohol/week: 1.0 standard drink  ?  Types: 1 Glasses of wine per week  ?  Comment: rarely  ? Drug use: Not Currently  ?  Types: Marijuana  ?  Comment: smokes daily  ? ? ? ?Allergies   ?Patient has no known allergies. ? ? ?Review of Systems ?Review of Systems  ?Constitutional:  Negative for chills and fever.  ?HENT:  Negative for ear pain and sore throat.   ?Eyes:  Negative for pain and visual disturbance.  ?Respiratory:  Negative for cough and shortness of breath.   ?Cardiovascular:  Negative for chest pain  and palpitations.  ?Gastrointestinal:  Negative for abdominal pain and vomiting.  ?Genitourinary:  Negative for dysuria and hematuria.  ?Musculoskeletal:  Positive for back pain. Negative for arthralgias.  ?Skin:  Negative for color change and rash.  ?Neurological:  Negative for seizures and syncope.  ?All other systems reviewed and are negative. ? ? ?Physical Exam ?Triage Vital Signs ?ED Triage Vitals  ?Enc Vitals Group  ?   BP 09/15/21 1633 (!) 143/90  ?   Pulse Rate 09/15/21 1633 87  ?   Resp 09/15/21 1633 18  ?   Temp 09/15/21 1633 98.2 ?F (36.8 ?C)  ?   Temp Source 09/15/21 1633 Oral  ?   SpO2 09/15/21 1633 100 %  ?   Weight --   ?   Height --   ?   Head Circumference --   ?   Peak Flow --   ?   Pain Score 09/15/21 1632 8  ?   Pain Loc --   ?   Pain Edu? --   ?   Excl. in GC? --   ? ?No data found. ? ?Updated Vital Signs ?BP (!) 143/90 (BP Location: Right Arm)   Pulse 87   Temp 98.2 ?F (36.8 ?C) (Oral)   Resp 18   SpO2 100%  ? ?Visual Acuity ?Right Eye Distance:   ?Left Eye Distance:   ?Bilateral Distance:   ? ?Right Eye Near:   ?Left Eye Near:    ?Bilateral Near:    ? ?Physical Exam ?Vitals and nursing note reviewed.  ?Constitutional:   ?   General: He is not in acute distress. ?   Appearance: He is well-developed.  ?HENT:  ?   Head: Normocephalic and atraumatic.  ?Eyes:  ?   Conjunctiva/sclera: Conjunctivae normal.  ?Cardiovascular:  ?   Rate and Rhythm: Normal rate and regular rhythm.  ?   Heart sounds: No murmur heard. ?Pulmonary:  ?   Effort: Pulmonary effort is normal. No respiratory distress.  ?   Breath sounds: Normal breath sounds.  ?Abdominal:  ?   Palpations: Abdomen is soft.  ?   Tenderness: There is no abdominal tenderness.  ?Musculoskeletal:     ?   General: No swelling.  ?   Cervical back: Neck supple.  ?   Comments: Mild tenderness to lumbar spine musculature, negative SLR.   ?Skin: ?   General: Skin is warm and dry.  ?   Capillary Refill: Capillary refill takes less than 2 seconds.   ?Neurological:  ?   Mental Status: He is alert.  ?Psychiatric:     ?   Mood and Affect: Mood normal.  ? ? ? ?UC Treatments / Results  ?Labs ?(all labs ordered are listed, but only abnormal results are displayed) ?Labs Reviewed - No data to display ? ?EKG ? ? ?Radiology ?No results found. ? ?Procedures ?Procedures (including critical care  time) ? ?Medications Ordered in UC ?Medications - No data to display ? ?Initial Impression / Assessment and Plan / UC Course  ?I have reviewed the triage vital signs and the nursing notes. ? ?Pertinent labs & imaging results that were available during my care of the patient were reviewed by me and considered in my medical decision making (see chart for details). ? ?  ? ?Low back pain.  Advised NSAIDs, flexeril prescribed.   Supportive treatment discussed.  Return precautions discussed.  ?Final Clinical Impressions(s) / UC Diagnoses  ? ?Final diagnoses:  ?Acute midline low back pain with left-sided sciatica  ? ? ? ?Discharge Instructions   ? ?  ?Recommend 600 mg Ibuprofen every 8 hours as needed ?Recommend ice to affected area, light stretching, light walking ?Take flexeril as needed for muscle spasm ?Return if symptoms become worse.  ? ? ?ED Prescriptions   ? ? Medication Sig Dispense Auth. Provider  ? cyclobenzaprine (FLEXERIL) 5 MG tablet Take 2 tablets (10 mg total) by mouth 3 (three) times daily as needed for up to 7 days for muscle spasms. 21 tablet Ward, Tylene Fantasia, PA-C  ? ?  ? ?PDMP not reviewed this encounter. ?  ?Ward, Tylene Fantasia, PA-C ?09/15/21 1700 ? ?

## 2021-10-27 ENCOUNTER — Ambulatory Visit: Payer: Self-pay | Admitting: Nurse Practitioner

## 2021-10-29 ENCOUNTER — Ambulatory Visit (INDEPENDENT_AMBULATORY_CARE_PROVIDER_SITE_OTHER): Payer: Self-pay | Admitting: Nurse Practitioner

## 2021-10-29 ENCOUNTER — Encounter: Payer: Self-pay | Admitting: Nurse Practitioner

## 2021-10-29 VITALS — BP 143/99 | HR 83 | Temp 98.4°F | Ht 70.0 in | Wt 368.1 lb

## 2021-10-29 DIAGNOSIS — Z0000 Encounter for general adult medical examination without abnormal findings: Secondary | ICD-10-CM

## 2021-10-29 DIAGNOSIS — I10 Essential (primary) hypertension: Secondary | ICD-10-CM

## 2021-10-29 MED ORDER — AMLODIPINE BESYLATE 10 MG PO TABS: 10.0000 mg | ORAL_TABLET | Freq: Every day | ORAL | 1 refills | Status: DC

## 2021-10-29 NOTE — Progress Notes (Signed)
@Patient  ID: , male    DOB: 1986/02/17, 36 y.o.   MRN: [id number] ? ?Chief Complaint  ?Patient presents with  ? Follow-up  ?  Pt is here for 2 month BP follow up. Pt stated he just took his BP medication.  ? ? ?Referring provider: ?[id number], NP ? ?HPI ? ?Darius Joyce presents for follow up. He  has a past medical history of Hilar adenopathy (12/23/2020).  ? ?Subjective:  ?Darius Joyce is a 36 y.o. male with hypertension. ?Current Outpatient Medications  ?Medication Sig Dispense Refill  ? albuterol (VENTOLIN HFA) 108 (90 Base) MCG/ACT inhaler Inhale 2 puffs into the lungs every 6 (six) hours as needed. 18 g 5  ? amLODipine (NORVASC) 10 MG tablet Take 1 tablet (10 mg total) by mouth daily. 90 tablet 1  ? fluticasone furoate-vilanterol (BREO ELLIPTA) 100-25 MCG/INH AEPB Inhale 1 puff into the lungs daily. (Patient not taking: Reported on 07/14/2021) 28 each 0  ? ?No current facility-administered medications for this visit.  ?  ?taking medications as instructed, no medication side effects noted, no TIA's, no chest pain on exertion, no dyspnea on exertion, and no swelling of ankles. States that he has not been following low salt diet. Does not regularly check blood pressures at home. Has been going to the gym. Denies f/c/s, n/v/d, hemoptysis, PND, chest pain or edema. ? ? ? ? ?No Known Allergies ? ? ?There is no immunization history on file for this patient. ? ?Past Medical History:  ?Diagnosis Date  ? Hilar adenopathy 12/23/2020  ? ? ?Tobacco History: ?Social History  ? ?Tobacco Use  ?Smoking Status Never  ?Smokeless Tobacco Never  ? ?Counseling given: Not Answered ? ? ?Outpatient Encounter Medications as of 10/29/2021  ?Medication Sig  ? albuterol (VENTOLIN HFA) 108 (90 Base) MCG/ACT inhaler Inhale 2 puffs into the lungs every 6 (six) hours as needed.  ? [DISCONTINUED] amLODipine (NORVASC) 10 MG tablet Take 1 tablet (10 mg total) by mouth daily.  ? amLODipine (NORVASC) 10 MG tablet Take 1  tablet (10 mg total) by mouth daily.  ? fluticasone furoate-vilanterol (BREO ELLIPTA) 100-25 MCG/INH AEPB Inhale 1 puff into the lungs daily. (Patient not taking: Reported on 07/14/2021)  ? ?No facility-administered encounter medications on file as of 10/29/2021.  ? ? ? ?Review of Systems ? ?Review of Systems  ?Constitutional: Negative.   ?HENT: Negative.    ?Cardiovascular: Negative.   ?Gastrointestinal: Negative.   ?Allergic/Immunologic: Negative.   ?Neurological: Negative.   ?Psychiatric/Behavioral: Negative.     ? ? ? ?Physical Exam ? ?BP (!) 143/99 (BP Location: Right Arm, Cuff Size: Large)   Pulse 83   Temp 98.4 ?F (36.9 ?C)   Ht 5\' 10"  (1.778 m)   Wt (!) 368 lb 2 oz (167 kg)   SpO2 100%   BMI 52.82 kg/m?  ? ?Wt Readings from Last 5 Encounters:  ?10/29/21 (!) 368 lb 2 oz (167 kg)  ?08/27/21 (!) 355 lb 0.6 oz (161 kg)  ?07/14/21 (!) 354 lb 9.6 oz (160.8 kg)  ?03/20/21 (!) 317 lb 0.6 oz (143.8 kg)  ?02/05/21 (!) 307 lb 0.6 oz (139.3 kg)  ? ? ? ?Physical Exam ?Vitals and nursing note reviewed.  ?Constitutional:   ?   General: He is not in acute distress. ?   Appearance: He is well-developed.  ?Cardiovascular:  ?   Rate and Rhythm: Normal rate and regular rhythm.  ?Pulmonary:  ?   Effort: Pulmonary effort is normal.  ?  Breath sounds: Normal breath sounds.  ?Skin: ?   General: Skin is warm and dry.  ?Neurological:  ?   Mental Status: He is alert and oriented to person, place, and time.  ? ? ? ?Lab Results: ? ?CBC ?   ?Component Value Date/Time  ? WBC 6.0 12/24/2020 0046  ? RBC 4.73 12/24/2020 0046  ? HGB 13.3 12/24/2020 0046  ? HCT 39.7 12/24/2020 0046  ? PLT 306 12/24/2020 0046  ? MCV 83.9 12/24/2020 0046  ? MCH 28.1 12/24/2020 0046  ? MCHC 33.5 12/24/2020 0046  ? RDW 13.7 12/24/2020 0046  ? LYMPHSABS 1.2 12/22/2020 1313  ? MONOABS 1.2 (H) 12/22/2020 1313  ? EOSABS 0.1 12/22/2020 1313  ? BASOSABS 0.0 12/22/2020 1313  ? ? ?BMET ?   ?Component Value Date/Time  ? NA 141 07/14/2021 1545  ? K 4.4 07/14/2021 1545   ? CL 103 07/14/2021 1545  ? CO2 27 12/24/2020 0046  ? GLUCOSE 97 07/14/2021 1545  ? GLUCOSE 94 12/24/2020 0046  ? BUN 8 07/14/2021 1545  ? CREATININE 1.02 07/14/2021 1545  ? CALCIUM 9.4 07/14/2021 1545  ? GFRNONAA >60 12/24/2020 0046  ? ? ? ?Assessment & Plan:  ? ?Hypertension ?- amLODipine (NORVASC) 10 MG tablet; Take 1 tablet (10 mg total) by mouth daily.  Dispense: 90 tablet; Refill: 1 ?-follow low salt diet ?-stay active ?-work on healthy weight loss ?- download my fitness pal app ? ?Follow up: ? ?Follow up in 2 months ? ? ?Patient Instructions  ?1. Hypertension, unspecified type ? ?- amLODipine (NORVASC) 10 MG tablet; Take 1 tablet (10 mg total) by mouth daily.  Dispense: 90 tablet; Refill: 1 ?-follow low salt diet ?-stay active ?-work on healthy weight loss ?- download my fitness pal app ? ?Follow up: ? ?Follow up in 2 months ? ?Blood Pressure Record Sheet ?To take your blood pressure, you will need a blood pressure machine. You may be prescribed one, or you can buy a blood pressure machine (blood pressure monitor) at your clinic, drug store, or online. When choosing one, look for these features: ?An automatic monitor that has an arm cuff. ?A cuff that wraps snugly, but not too tightly, around your upper arm. You should be able to fit only one finger between your arm and the cuff. ?A device that stores blood pressure reading results. ?Do not choose a monitor that measures your blood pressure from your wrist or finger. ?Follow your health care provider's instructions for how to take your blood pressure. To use this form: ?Get one reading in the morning (a.m.) before you take any medicines. ?Get one reading in the evening (p.m.) before supper. ?Take at least two readings with each blood pressure check. This makes sure the results are correct. Wait 1-2 minutes between measurements. ?Write down the results in the spaces on this form. ?Repeat this once a week, or as told by your health care provider. ?Make a  follow-up appointment with your health care provider to discuss the results. ?Blood pressure log ?Date: _______________________ ?a.m. _____________________(1st reading) _____________________(2nd reading) ?p.m. _____________________(1st reading) _____________________(2nd reading) ?Date: _______________________ ?a.m. _____________________(1st reading) _____________________(2nd reading) ?p.m. _____________________(1st reading) _____________________(2nd reading) ?Date: _______________________ ?a.m. _____________________(1st reading) _____________________(2nd reading) ?p.m. _____________________(1st reading) _____________________(2nd reading) ?Date: _______________________ ?a.m. _____________________(1st reading) _____________________(2nd reading) ?p.m. _____________________(1st reading) _____________________(2nd reading) ?Date: _______________________ ?a.m. _____________________(1st reading) _____________________(2nd reading) ?p.m. _____________________(1st reading) _____________________(2nd reading) ?This information is not intended to replace advice given to you by your health care provider. Make sure you discuss any questions  you have with your health care provider. ?Document Revised: 03/06/2021 Document Reviewed: 03/06/2021 ?Elsevier Patient Education ? 2023 [employer] ? ? ?DASH Eating Plan ?DASH stands for Dietary Approaches to Stop Hypertension. The DASH eating plan is a healthy eating plan that has been shown to: ?Reduce high blood pressure (hypertension). ?Reduce your risk for type 2 diabetes, heart disease, and stroke. ?Help with weight loss. ?What are tips for following this plan? ?Reading food labels ?Check food labels for the amount of salt (sodium) per serving. Choose foods with less than 5 percent of the Daily Value of sodium. Generally, foods with less than 300 milligrams (mg) of sodium per serving fit into this eating plan. ?To find whole grains, look for the word "whole" as the first word in the  ingredient list. ?Shopping ?Buy products labeled as "low-sodium" or "no salt added." ?Buy fresh foods. Avoid canned foods and pre-made or frozen meals. ?Cooking ?Avoid adding salt when cooking. Use salt-free seasonings or

## 2021-10-29 NOTE — Patient Instructions (Addendum)
1. Hypertension, unspecified type ? ?- amLODipine (NORVASC) 10 MG tablet; Take 1 tablet (10 mg total) by mouth daily.  Dispense: 90 tablet; Refill: 1 ?-follow low salt diet ?-stay active ?-work on healthy weight loss ?- download my fitness pal app ? ?Follow up: ? ?Follow up in 2 months ? ?Blood Pressure Record Sheet ?To take your blood pressure, you will need a blood pressure machine. You may be prescribed one, or you can buy a blood pressure machine (blood pressure monitor) at your clinic, drug store, or online. When choosing one, look for these features: ?An automatic monitor that has an arm cuff. ?A cuff that wraps snugly, but not too tightly, around your upper arm. You should be able to fit only one finger between your arm and the cuff. ?A device that stores blood pressure reading results. ?Do not choose a monitor that measures your blood pressure from your wrist or finger. ?Follow your health care provider's instructions for how to take your blood pressure. To use this form: ?Get one reading in the morning (a.m.) before you take any medicines. ?Get one reading in the evening (p.m.) before supper. ?Take at least two readings with each blood pressure check. This makes sure the results are correct. Wait 1-2 minutes between measurements. ?Write down the results in the spaces on this form. ?Repeat this once a week, or as told by your health care provider. ?Make a follow-up appointment with your health care provider to discuss the results. ?Blood pressure log ?Date: _______________________ ?a.m. _____________________(1st reading) _____________________(2nd reading) ?p.m. _____________________(1st reading) _____________________(2nd reading) ?Date: _______________________ ?a.m. _____________________(1st reading) _____________________(2nd reading) ?p.m. _____________________(1st reading) _____________________(2nd reading) ?Date: _______________________ ?a.m. _____________________(1st reading) _____________________(2nd  reading) ?p.m. _____________________(1st reading) _____________________(2nd reading) ?Date: _______________________ ?a.m. _____________________(1st reading) _____________________(2nd reading) ?p.m. _____________________(1st reading) _____________________(2nd reading) ?Date: _______________________ ?a.m. _____________________(1st reading) _____________________(2nd reading) ?p.m. _____________________(1st reading) _____________________(2nd reading) ?This information is not intended to replace advice given to you by your health care provider. Make sure you discuss any questions you have with your health care provider. ?Document Revised: 03/06/2021 Document Reviewed: 03/06/2021 ?Elsevier Patient Education ? [employer]. ? ? ?DASH Eating Plan ?DASH stands for Dietary Approaches to Stop Hypertension. The DASH eating plan is a healthy eating plan that has been shown to: ?Reduce high blood pressure (hypertension). ?Reduce your risk for type 2 diabetes, heart disease, and stroke. ?Help with weight loss. ?What are tips for following this plan? ?Reading food labels ?Check food labels for the amount of salt (sodium) per serving. Choose foods with less than 5 percent of the Daily Value of sodium. Generally, foods with less than 300 milligrams (mg) of sodium per serving fit into this eating plan. ?To find whole grains, look for the word "whole" as the first word in the ingredient list. ?Shopping ?Buy products labeled as "low-sodium" or "no salt added." ?Buy fresh foods. Avoid canned foods and pre-made or frozen meals. ?Cooking ?Avoid adding salt when cooking. Use salt-free seasonings or herbs instead of table salt or sea salt. Check with your health care provider or pharmacist before using salt substitutes. ?Do not fry foods. Cook foods using healthy methods such as baking, boiling, grilling, roasting, and broiling instead. ?Cook with heart-healthy oils, such as olive, canola, avocado, soybean, or sunflower oil. ?Meal  planning ? ?Eat a balanced diet that includes: ?4 or more servings of fruits and 4 or more servings of vegetables each day. Try to fill one-half of your plate with fruits and vegetables. ?6-8 servings of whole grains each day. ?  Less than 6 oz (170 g) of lean meat, poultry, or fish each day. A 3-oz (85-g) serving of meat is about the same size as a deck of cards. One egg equals 1 oz (28 g). ?2-3 servings of low-fat dairy each day. One serving is 1 cup (237 mL). ?1 serving of nuts, seeds, or beans 5 times each week. ?2-3 servings of heart-healthy fats. Healthy fats called omega-3 fatty acids are found in foods such as walnuts, flaxseeds, fortified milks, and eggs. These fats are also found in cold-water fish, such as sardines, salmon, and mackerel. ?Limit how much you eat of: ?Canned or prepackaged foods. ?Food that is high in trans fat, such as some fried foods. ?Food that is high in saturated fat, such as fatty meat. ?Desserts and other sweets, sugary drinks, and other foods with added sugar. ?Full-fat dairy products. ?Do not salt foods before eating. ?Do not eat more than 4 egg yolks a week. ?Try to eat at least 2 vegetarian meals a week. ?Eat more home-cooked food and less restaurant, buffet, and fast food. ?Lifestyle ?When eating at a restaurant, ask that your food be prepared with less salt or no salt, if possible. ?If you drink alcohol: ?Limit how much you use to: ?0-1 drink a day for women who are not pregnant. ?0-2 drinks a day for men. ?Be aware of how much alcohol is in your drink. In the [place] one drink equals one 12 oz bottle of beer (355 mL), one 5 oz glass of wine (148 mL), or one 1? oz glass of hard liquor (44 mL). ?General information ?Avoid eating more than 2,300 mg of salt a day. If you have hypertension, you may need to reduce your sodium intake to 1,500 mg a day. ?Work with your health care provider to maintain a healthy body weight or to lose weight. Ask what an ideal weight is for you. ?Get at  least 30 minutes of exercise that causes your heart to beat faster (aerobic exercise) most days of the week. Activities may include walking, swimming, or biking. ?Work with your health care provider or dietitian to adjust your eating plan to your individual calorie needs. ?What foods should I eat? ?Fruits ?All fresh, dried, or frozen fruit. Canned fruit in natural juice (without added sugar). ?Vegetables ?Fresh or frozen vegetables (raw, steamed, roasted, or grilled). Low-sodium or reduced-sodium tomato and vegetable juice. Low-sodium or reduced-sodium tomato sauce and tomato paste. Low-sodium or reduced-sodium canned vegetables. ?Grains ?Whole-grain or whole-wheat bread. Whole-grain or whole-wheat pasta. Brown rice. [place]. Bulgur. Whole-grain and low-sodium cereals. Pita bread. Low-fat, low-sodium crackers. Whole-wheat flour tortillas. ?Meats and other proteins ?Skinless chicken or [place]. Ground chicken or [place]. Pork with fat trimmed off. Fish and seafood. Egg whites. Dried beans, peas, or lentils. Unsalted nuts, nut butters, and seeds. Unsalted canned beans. Lean cuts of beef with fat trimmed off. Low-sodium, lean precooked or cured meat, such as sausages or meat loaves. ?Dairy ?Low-fat (1%) or fat-free (skim) milk. Reduced-fat, low-fat, or fat-free cheeses. Nonfat, low-sodium ricotta or cottage cheese. Low-fat or nonfat yogurt. Low-fat, low-sodium cheese. ?Fats and oils ?Soft margarine without trans fats. Vegetable oil. Reduced-fat, low-fat, or light mayonnaise and salad dressings (reduced-sodium). Canola, safflower, olive, avocado, soybean, and sunflower oils. Avocado. ?Seasonings and condiments ?Herbs. Spices. Seasoning mixes without salt. ?Other foods ?Unsalted popcorn and pretzels. Fat-free sweets. ?The items listed above may not be a complete list of foods and beverages you can eat. Contact a dietitian for more information. ?What foods should  I avoid? ?Fruits ?Canned fruit in a light or heavy  syrup. Fried fruit. Fruit in cream or butter sauce. ?Vegetables ?Creamed or fried vegetables. Vegetables in a cheese sauce. Regular canned vegetables (not low-sodium or reduced-sodium). Regular canned tomato sa

## 2021-10-29 NOTE — Assessment & Plan Note (Signed)
-   amLODipine (NORVASC) 10 MG tablet; Take 1 tablet (10 mg total) by mouth daily.  Dispense: 90 tablet; Refill: 1 ?-follow low salt diet ?-stay active ?-work on healthy weight loss ?- download my fitness pal app ? ?Follow up: ? ?Follow up in 2 months ?

## 2021-10-30 LAB — LIPID PANEL
Chol/HDL Ratio: 4.1 ratio (ref 0.0–5.0)
Cholesterol, Total: 163 mg/dL (ref 100–199)
HDL: 40 mg/dL (ref >39)
LDL Chol Calc (NIH): 95 mg/dL (ref 0–99)
Triglycerides: 161 mg/dL — ABNORMAL HIGH (ref 0–149)
VLDL Cholesterol Cal: 28 mg/dL (ref 5–40)

## 2021-12-29 ENCOUNTER — Ambulatory Visit: Payer: Self-pay | Admitting: Nurse Practitioner

## 2022-02-14 IMAGING — CT CT ANGIO CHEST
2 of 7 series · 17 of 46 positions shown · IV contrast (omnipaque)
Comparison: Chest radiograph December 22, 2020

CLINICAL DATA: Suspected pulmonary embolus. Recent diagnosis of
pneumonia.

EXAM:
CT ANGIOGRAPHY CHEST WITH CONTRAST
TECHNIQUE: Multidetector CT imaging of the chest was performed using the
standard protocol during bolus administration of intravenous
contrast. Multiplanar CT image reconstructions and MIPs were
obtained to evaluate the vascular anatomy.
CONTRAST:  100mL OMNIPAQUE IOHEXOL 350 MG/ML SOLN

[Series 6: arterial · axial · arterial · 0.79mm/px · z∈[+1202,+1448]mm · 14 of 138 slices shown]
[im 10/138  lung]
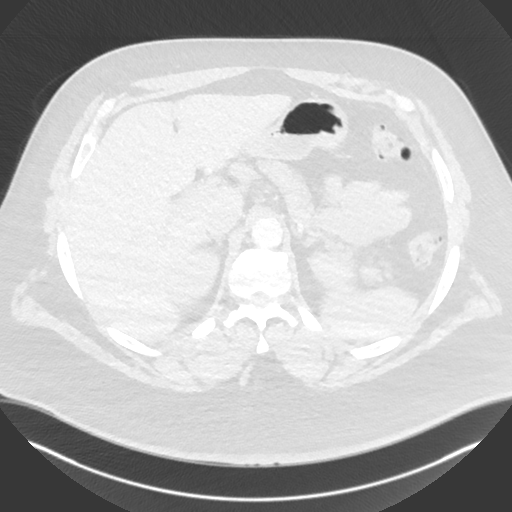
[im 19/138  soft-tissue]
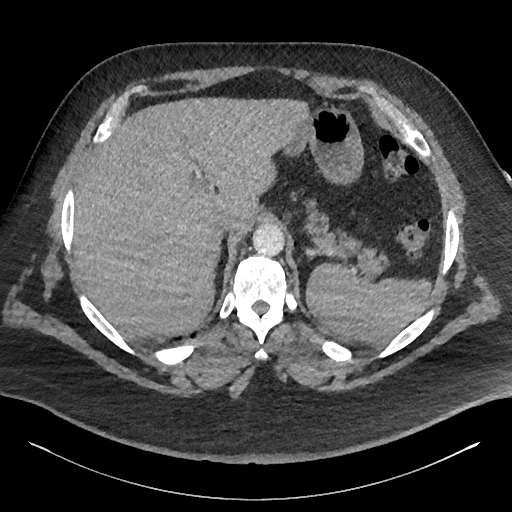
[im 29/138  lung]
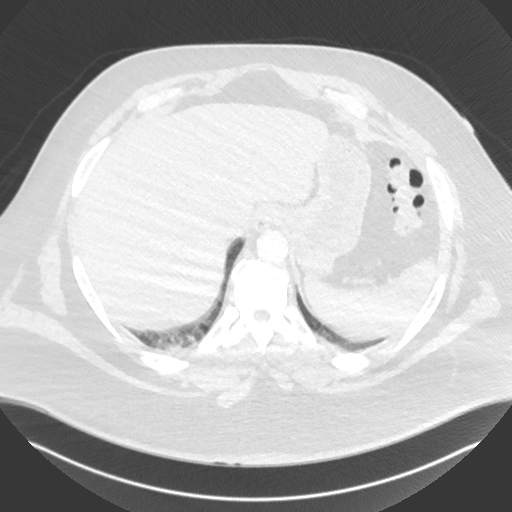
[im 38/138  soft-tissue]
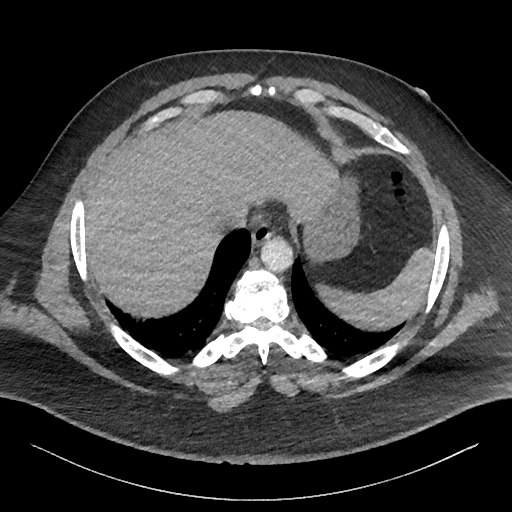
[im 48/138  lung]
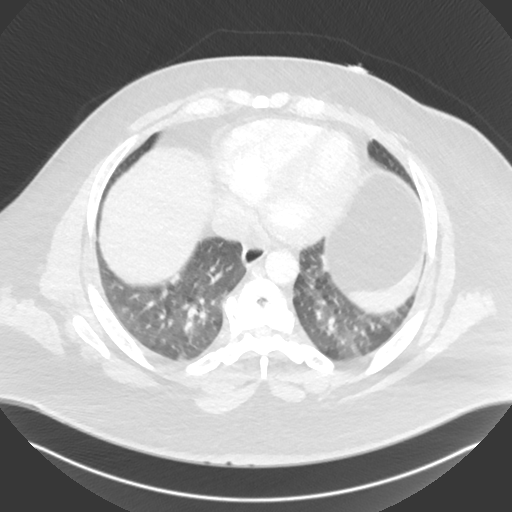
[im 57/138  soft-tissue]
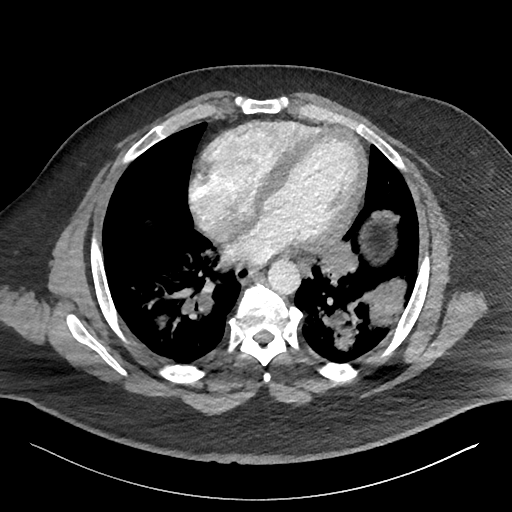
[im 67/138  lung]
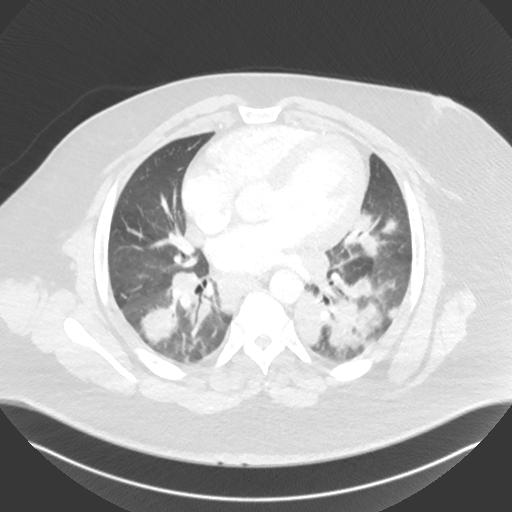
[im 76/138  soft-tissue]
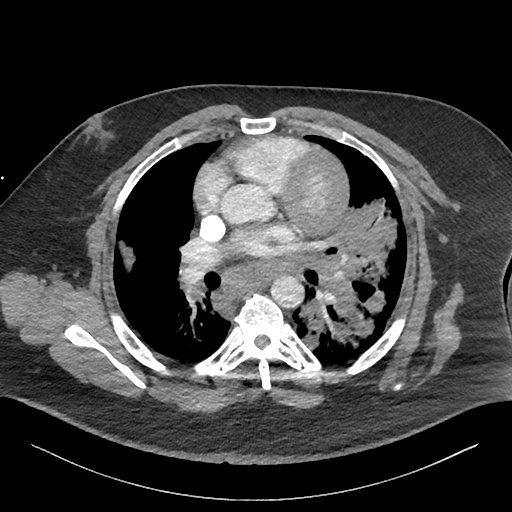
[im 86/138  lung]
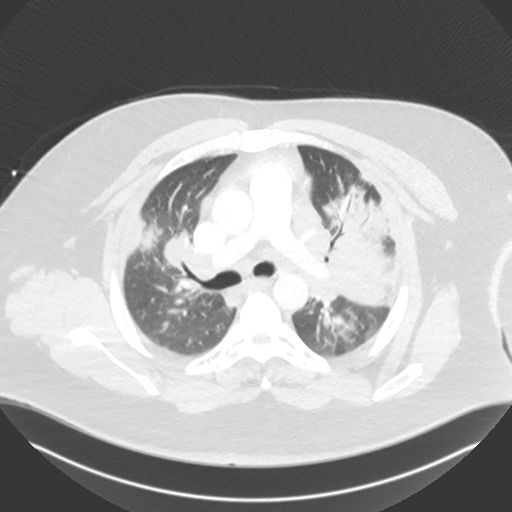
[im 95/138  soft-tissue]
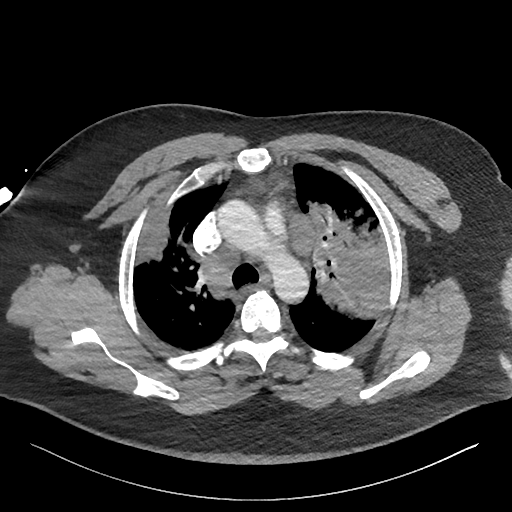
[im 104/138  lung]
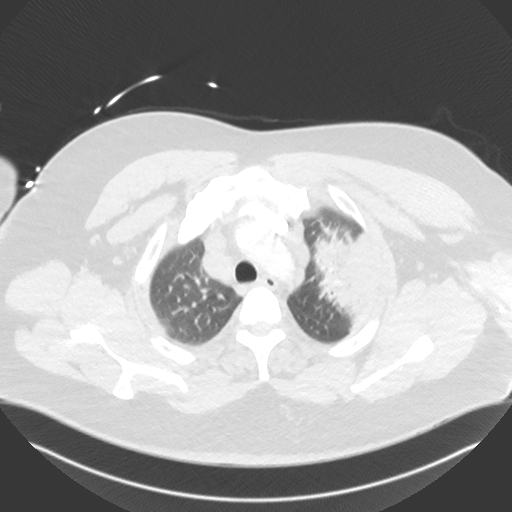
[im 114/138  soft-tissue]
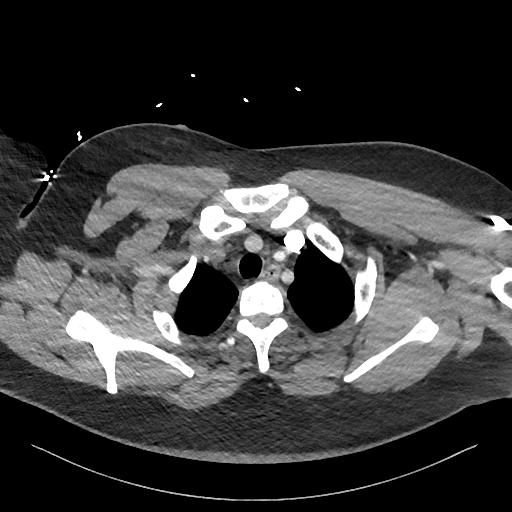
[im 123/138  lung]
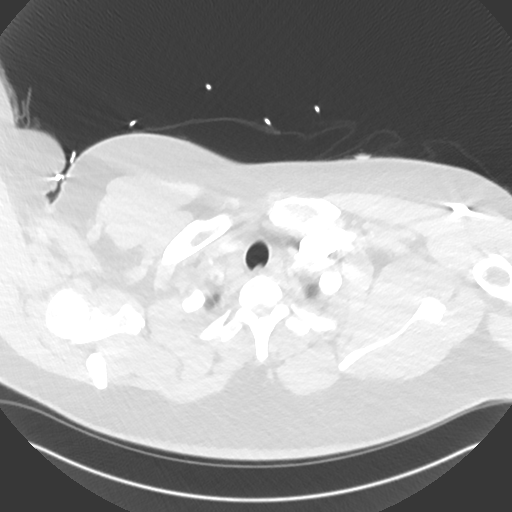
[im 133/138  soft-tissue]
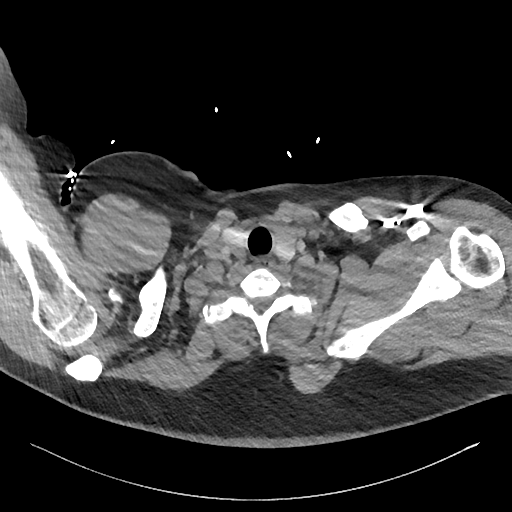

[Series 10: cor · coronal · 0.54mm/px · 3 of 175 slices shown]
[im 44/175  soft-tissue]
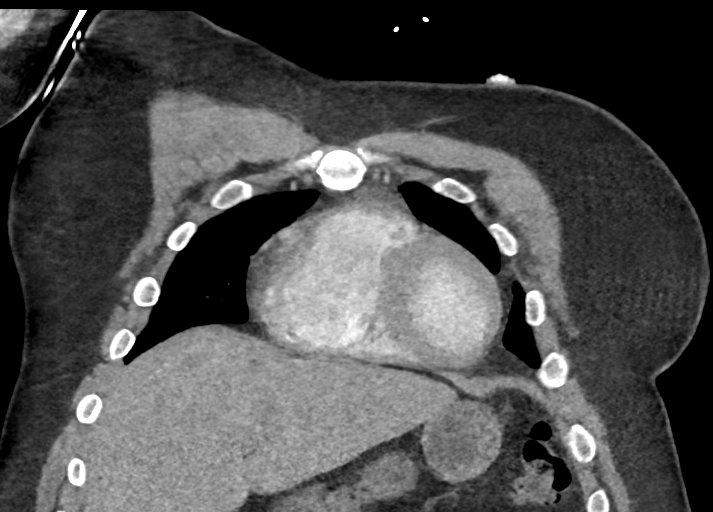
[im 88/175  soft-tissue]
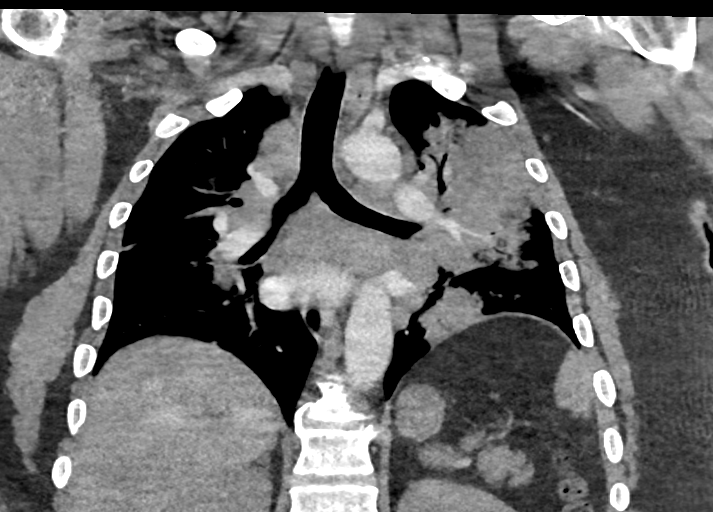
[im 131/175  soft-tissue]
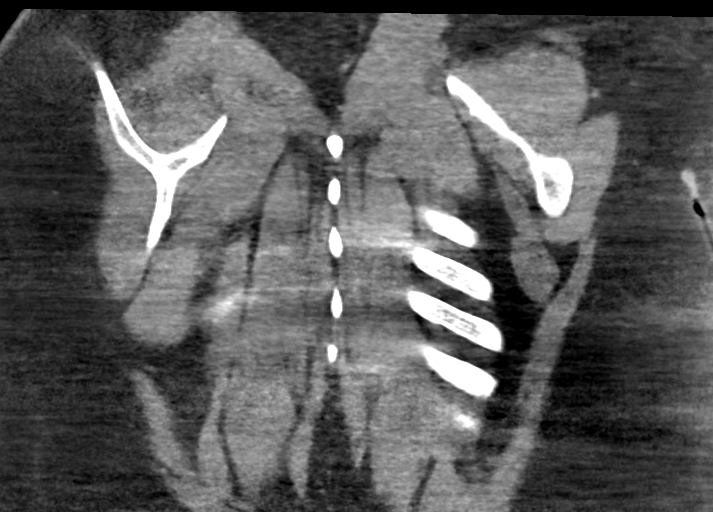

[17 of 46 positions shown; findings below may reference images not displayed]

FINDINGS: Cardiovascular: Satisfactory opacification of the pulmonary arteries
to the segmental level. No evidence of pulmonary embolism. Normal
heart size. No pericardial effusion.

Mediastinum/Nodes: Bulky mediastinal and bilateral hilar
lymphadenopathy. 5.6 x 2.5 cm soft tissue mass containing scattered
calcifications versus conglomeration of abnormal lymph nodes in the
right infra carinal region in the posterior mediastinum. Normal
trachea, esophagus and thyroid gland.

Lungs/Pleura: Numerous areas of masslike consolidation versus soft
tissue masses throughout both lungs. Large area of confluent
consolidation in the left upper lobe. No evidence of pleural
effusion or pneumothorax.

Upper Abdomen: No acute abnormality.

Musculoskeletal: No chest wall abnormality. No acute or significant
osseous findings.

Review of the MIP images confirms the above findings.
IMPRESSION: 1. No evidence of pulmonary embolus.
2. Bulky mediastinal and bilateral hilar lymphadenopathy.
3. Numerous areas of masslike consolidation versus soft tissue
masses throughout both lungs. Findings may represent multifocal
pneumonia, however metastatic disease, lymphoma,pulmonary malignancy
or autoimmune/inflammatory pulmonary disease are in the differential
diagnosis. If patient's clinical presentation does not support
multifocal pneumonia, then pulmonology consultation is recommended.

These results were called by telephone at the time of interpretation
acknowledged these results.

## 2022-08-26 ENCOUNTER — Encounter: Payer: Self-pay | Admitting: Nurse Practitioner

## 2022-08-26 ENCOUNTER — Ambulatory Visit (INDEPENDENT_AMBULATORY_CARE_PROVIDER_SITE_OTHER): Payer: Self-pay | Admitting: Nurse Practitioner

## 2022-08-26 VITALS — BP 149/103 | HR 95 | Temp 97.3°F | Wt 375.2 lb

## 2022-08-26 DIAGNOSIS — Z13220 Encounter for screening for lipoid disorders: Secondary | ICD-10-CM

## 2022-08-26 DIAGNOSIS — I10 Essential (primary) hypertension: Secondary | ICD-10-CM

## 2022-08-26 MED ORDER — AMLODIPINE BESYLATE 10 MG PO TABS: 10.0000 mg | ORAL_TABLET | Freq: Every day | ORAL | 1 refills | Status: DC

## 2022-08-26 NOTE — Patient Instructions (Addendum)
1. Hypertension, unspecified type  - amLODipine (NORVASC) 10 MG tablet; Take 1 tablet (10 mg total) by mouth daily.  Dispense: 90 tablet; Refill: 1 - CBC - Comprehensive metabolic panel  2. Lipid screening  - Lipid Panel  Follow up in 3 months  DASH Eating Plan DASH stands for Dietary Approaches to Stop Hypertension. The DASH eating plan is a healthy eating plan that has been shown to: Reduce high blood pressure (hypertension). Reduce your risk for type 2 diabetes, heart disease, and stroke. Help with weight loss. What are tips for following this plan? Reading food labels Check food labels for the amount of salt (sodium) per serving. Choose foods with less than 5 percent of the Daily Value of sodium. Generally, foods with less than 300 milligrams (mg) of sodium per serving fit into this eating plan. To find whole grains, look for the word "whole" as the first word in the ingredient list. Shopping Buy products labeled as "low-sodium" or "no salt added." Buy fresh foods. Avoid canned foods and pre-made or frozen meals. Cooking Avoid adding salt when cooking. Use salt-free seasonings or herbs instead of table salt or sea salt. Check with your health care provider or pharmacist before using salt substitutes. Do not fry foods. Cook foods using healthy methods such as baking, boiling, grilling, roasting, and broiling instead. Cook with heart-healthy oils, such as olive, canola, avocado, soybean, or sunflower oil. Meal planning  Eat a balanced diet that includes: 4 or more servings of fruits and 4 or more servings of vegetables each day. Try to fill one-half of your plate with fruits and vegetables. 6-8 servings of whole grains each day. Less than 6 oz (170 g) of lean meat, poultry, or fish each day. A 3-oz (85-g) serving of meat is about the same size as a deck of cards. One egg equals 1 oz (28 g). 2-3 servings of low-fat dairy each day. One serving is 1 cup (237 mL). 1 serving of nuts,  seeds, or beans 5 times each week. 2-3 servings of heart-healthy fats. Healthy fats called omega-3 fatty acids are found in foods such as walnuts, flaxseeds, fortified milks, and eggs. These fats are also found in cold-water fish, such as sardines, salmon, and mackerel. Limit how much you eat of: Canned or prepackaged foods. Food that is high in trans fat, such as some fried foods. Food that is high in saturated fat, such as fatty meat. Desserts and other sweets, sugary drinks, and other foods with added sugar. Full-fat dairy products. Do not salt foods before eating. Do not eat more than 4 egg yolks a week. Try to eat at least 2 vegetarian meals a week. Eat more home-cooked food and less restaurant, buffet, and fast food. Lifestyle When eating at a restaurant, ask that your food be prepared with less salt or no salt, if possible. If you drink alcohol: Limit how much you use to: 0-1 drink a day for women who are not pregnant. 0-2 drinks a day for men. Be aware of how much alcohol is in your drink. In the [place] one drink equals one 12 oz bottle of beer (355 mL), one 5 oz glass of wine (148 mL), or one 1 oz glass of hard liquor (44 mL). General information Avoid eating more than 2,300 mg of salt a day. If you have hypertension, you may need to reduce your sodium intake to 1,500 mg a day. Work with your health care provider to maintain a healthy body weight or to  lose weight. Ask what an ideal weight is for you. Get at least 30 minutes of exercise that causes your heart to beat faster (aerobic exercise) most days of the week. Activities may include walking, swimming, or biking. Work with your health care provider or dietitian to adjust your eating plan to your individual calorie needs. What foods should I eat? Fruits All fresh, dried, or frozen fruit. Canned fruit in natural juice (without added sugar). Vegetables Fresh or frozen vegetables (raw, steamed, roasted, or grilled). Low-sodium or  reduced-sodium tomato and vegetable juice. Low-sodium or reduced-sodium tomato sauce and tomato paste. Low-sodium or reduced-sodium canned vegetables. Grains Whole-grain or whole-wheat bread. Whole-grain or whole-wheat pasta. Brown rice. [place]. Bulgur. Whole-grain and low-sodium cereals. Pita bread. Low-fat, low-sodium crackers. Whole-wheat flour tortillas. Meats and other proteins Skinless chicken or [place]. Ground chicken or [place]. Pork with fat trimmed off. Fish and seafood. Egg whites. Dried beans, peas, or lentils. Unsalted nuts, nut butters, and seeds. Unsalted canned beans. Lean cuts of beef with fat trimmed off. Low-sodium, lean precooked or cured meat, such as sausages or meat loaves. Dairy Low-fat (1%) or fat-free (skim) milk. Reduced-fat, low-fat, or fat-free cheeses. Nonfat, low-sodium ricotta or cottage cheese. Low-fat or nonfat yogurt. Low-fat, low-sodium cheese. Fats and oils Soft margarine without trans fats. Vegetable oil. Reduced-fat, low-fat, or light mayonnaise and salad dressings (reduced-sodium). Canola, safflower, olive, avocado, soybean, and sunflower oils. Avocado. Seasonings and condiments Herbs. Spices. Seasoning mixes without salt. Other foods Unsalted popcorn and pretzels. Fat-free sweets. The items listed above may not be a complete list of foods and beverages you can eat. Contact a dietitian for more information. What foods should I avoid? Fruits Canned fruit in a light or heavy syrup. Fried fruit. Fruit in cream or butter sauce. Vegetables Creamed or fried vegetables. Vegetables in a cheese sauce. Regular canned vegetables (not low-sodium or reduced-sodium). Regular canned tomato sauce and paste (not low-sodium or reduced-sodium). Regular tomato and vegetable juice (not low-sodium or reduced-sodium). Angie Fava. Olives. Grains Baked goods made with fat, such as croissants, muffins, or some breads. Dry pasta or rice meal packs. Meats and other  proteins Fatty cuts of meat. Ribs. Fried meat. Berniece Salines. Bologna, salami, and other precooked or cured meats, such as sausages or meat loaves. Fat from the back of a pig (fatback). Bratwurst. Salted nuts and seeds. Canned beans with added salt. Canned or smoked fish. Whole eggs or egg yolks. Chicken or [place] with skin. Dairy Whole or 2% milk, cream, and half-and-half. Whole or full-fat cream cheese. Whole-fat or sweetened yogurt. Full-fat cheese. Nondairy creamers. Whipped toppings. Processed cheese and cheese spreads. Fats and oils Butter. Stick margarine. Lard. Shortening. Ghee. Bacon fat. Tropical oils, such as coconut, palm kernel, or palm oil. Seasonings and condiments Onion salt, garlic salt, seasoned salt, table salt, and sea salt. Worcestershire sauce. Tartar sauce. Barbecue sauce. Teriyaki sauce. Soy sauce, including reduced-sodium. Steak sauce. Canned and packaged gravies. Fish sauce. Oyster sauce. Cocktail sauce. Store-bought horseradish. Ketchup. Mustard. Meat flavorings and tenderizers. Bouillon cubes. Hot sauces. Pre-made or packaged marinades. Pre-made or packaged taco seasonings. Relishes. Regular salad dressings. Other foods Salted popcorn and pretzels. The items listed above may not be a complete list of foods and beverages you should avoid. Contact a dietitian for more information. Where to find more information National Heart, Lung, and Blood Institute: https://wilson-eaton.com/ American Heart Association: www.heart.org Academy of Nutrition and Dietetics: www.eatright.[place]: www.kidney.org Summary The DASH eating plan is a healthy eating plan that has been  shown to reduce high blood pressure (hypertension). It may also reduce your risk for type 2 diabetes, heart disease, and stroke. When on the DASH eating plan, aim to eat more fresh fruits and vegetables, whole grains, lean proteins, low-fat dairy, and heart-healthy fats. With the DASH eating plan, you should  limit salt (sodium) intake to 2,300 mg a day. If you have hypertension, you may need to reduce your sodium intake to 1,500 mg a day. Work with your health care provider or dietitian to adjust your eating plan to your individual calorie needs. This information is not intended to replace advice given to you by your health care provider. Make sure you discuss any questions you have with your health care provider. Document Revised: 05/26/2019 Document Reviewed: 05/26/2019 Elsevier Patient Education  [place].

## 2022-08-26 NOTE — Assessment & Plan Note (Signed)
-   amLODipine (NORVASC) 10 MG tablet; Take 1 tablet (10 mg total) by mouth daily.  Dispense: 90 tablet; Refill: 1 - CBC - Comprehensive metabolic panel  2. Lipid screening  - Lipid Panel  Follow up in 3 months

## 2022-08-26 NOTE — Progress Notes (Addendum)
@Patient  ID: Darius Joyce, male    DOB: [birth year], 37 y.o.   MRN: [id number]  Chief Complaint  Patient presents with   Follow-up    B/p    Referring provider: Fenton Foy, NP   HPI   Patient presents today for a follow-up on hypertension.  Patient has been lost to follow-up for the past year.  He has been out of his medications for 6 months.  We will refill amlodipine today.  We will order labs today.  Patient will need to follow-up in 3 months. Denies f/c/s, n/v/d, hemoptysis, PND, leg swelling Denies chest pain or edema      No Known Allergies   There is no immunization history on file for this patient.  Past Medical History:  Diagnosis Date   Hilar adenopathy 12/23/2020    Tobacco History: Social History   Tobacco Use  Smoking Status Never  Smokeless Tobacco Never   Counseling given: Not Answered   Outpatient Encounter Medications as of 08/26/2022  Medication Sig   albuterol (VENTOLIN HFA) 108 (90 Base) MCG/ACT inhaler Inhale 2 puffs into the lungs every 6 (six) hours as needed.   amLODipine (NORVASC) 10 MG tablet Take 1 tablet (10 mg total) by mouth daily.   fluticasone furoate-vilanterol (BREO ELLIPTA) 100-25 MCG/INH AEPB Inhale 1 puff into the lungs daily. (Patient not taking: Reported on 07/14/2021)   [DISCONTINUED] amLODipine (NORVASC) 10 MG tablet Take 1 tablet (10 mg total) by mouth daily. (Patient not taking: Reported on 08/26/2022)   No facility-administered encounter medications on file as of 08/26/2022.     Review of Systems  Review of Systems  Constitutional: Negative.   HENT: Negative.    Cardiovascular: Negative.   Gastrointestinal: Negative.   Allergic/Immunologic: Negative.   Neurological: Negative.   Psychiatric/Behavioral: Negative.         Physical Exam  BP (!) 149/103   Pulse 95   Temp (!) 97.3 F (36.3 C)   Wt (!) 375 lb 3.2 oz (170.2 kg)   SpO2 99%   BMI 53.84 kg/m   Wt Readings from Last 5 Encounters:   08/26/22 (!) 375 lb 3.2 oz (170.2 kg)  10/29/21 (!) 368 lb 2 oz (167 kg)  08/27/21 (!) 355 lb 0.6 oz (161 kg)  07/14/21 (!) 354 lb 9.6 oz (160.8 kg)  03/20/21 (!) 317 lb 0.6 oz (143.8 kg)     Physical Exam Vitals and nursing note reviewed.  Constitutional:      General: He is not in acute distress.    Appearance: He is well-developed.  Cardiovascular:     Rate and Rhythm: Normal rate and regular rhythm.  Pulmonary:     Effort: Pulmonary effort is normal.     Breath sounds: Normal breath sounds.  Skin:    General: Skin is warm and dry.  Neurological:     Mental Status: He is alert and oriented to person, place, and time.      Lab Results:  CBC    Component Value Date/Time   WBC 6.0 12/24/2020 0046   RBC 4.73 12/24/2020 0046   HGB 13.3 12/24/2020 0046   HCT 39.7 12/24/2020 0046   PLT 306 12/24/2020 0046   MCV 83.9 12/24/2020 0046   MCH 28.1 12/24/2020 0046   MCHC 33.5 12/24/2020 0046   RDW 13.7 12/24/2020 0046   LYMPHSABS 1.2 12/22/2020 1313   MONOABS 1.2 (H) 12/22/2020 1313   EOSABS 0.1 12/22/2020 1313   BASOSABS 0.0 12/22/2020 1313  BMET    Component Value Date/Time   NA 141 07/14/2021 1545   K 4.4 07/14/2021 1545   CL 103 07/14/2021 1545   CO2 27 12/24/2020 0046   GLUCOSE 97 07/14/2021 1545   GLUCOSE 94 12/24/2020 0046   BUN 8 07/14/2021 1545   CREATININE 1.02 07/14/2021 1545   CALCIUM 9.4 07/14/2021 1545   GFRNONAA >60 12/24/2020 0046      Assessment & Plan:   Hypertension - amLODipine (NORVASC) 10 MG tablet; Take 1 tablet (10 mg total) by mouth daily.  Dispense: 90 tablet; Refill: 1 - CBC - Comprehensive metabolic panel  2. Lipid screening  - Lipid Panel  Follow up in 3 months     Fenton Foy, NP 08/26/2022

## 2022-08-27 LAB — COMPREHENSIVE METABOLIC PANEL
ALT: 21 IU/L (ref 0–44)
AST: 28 IU/L (ref 0–40)
Albumin/Globulin Ratio: 1 — ABNORMAL LOW (ref 1.2–2.2)
Albumin: 4 g/dL — ABNORMAL LOW (ref 4.1–5.1)
Alkaline Phosphatase: 91 IU/L (ref 44–121)
BUN/Creatinine Ratio: 11 (ref 9–20)
BUN: 11 mg/dL (ref 6–20)
Bilirubin Total: 0.3 mg/dL (ref 0.0–1.2)
CO2: 25 mmol/L (ref 20–29)
Calcium: 9.3 mg/dL (ref 8.7–10.2)
Chloride: 102 mmol/L (ref 96–106)
Creatinine, Ser: 0.98 mg/dL (ref 0.76–1.27)
Globulin, Total: 4 g/dL (ref 1.5–4.5)
Glucose: 104 mg/dL — ABNORMAL HIGH (ref 70–99)
Potassium: 3.7 mmol/L (ref 3.5–5.2)
Sodium: 141 mmol/L (ref 134–144)
Total Protein: 8 g/dL (ref 6.0–8.5)
eGFR: 102 mL/min/{1.73_m2} (ref >59)

## 2022-08-27 LAB — CBC
Hematocrit: 39.5 % (ref 37.5–51.0)
Hemoglobin: 12.8 g/dL — ABNORMAL LOW (ref 13.0–17.7)
MCH: 25.9 pg — ABNORMAL LOW (ref 26.6–33.0)
MCHC: 32.4 g/dL (ref 31.5–35.7)
MCV: 80 fL (ref 79–97)
Platelets: 301 10*3/uL (ref 150–450)
RBC: 4.94 x10E6/uL (ref 4.14–5.80)
RDW: 15.2 % (ref 11.6–15.4)
WBC: 6.5 10*3/uL (ref 3.4–10.8)

## 2022-08-27 LAB — LIPID PANEL
Chol/HDL Ratio: 4.1 ratio (ref 0.0–5.0)
Cholesterol, Total: 135 mg/dL (ref 100–199)
HDL: 33 mg/dL — ABNORMAL LOW (ref >39)
LDL Chol Calc (NIH): 82 mg/dL (ref 0–99)
Triglycerides: 108 mg/dL (ref 0–149)
VLDL Cholesterol Cal: 20 mg/dL (ref 5–40)

## 2022-08-28 NOTE — Progress Notes (Signed)
Pt advised.

## 2022-09-09 ENCOUNTER — Ambulatory Visit (INDEPENDENT_AMBULATORY_CARE_PROVIDER_SITE_OTHER): Payer: Self-pay

## 2022-09-09 VITALS — BP 145/75 | HR 90 | Temp 97.9°F | Wt 375.0 lb

## 2022-09-09 DIAGNOSIS — I10 Essential (primary) hypertension: Secondary | ICD-10-CM

## 2022-09-09 NOTE — Progress Notes (Signed)
   Blood Pressure Recheck Visit  Name: Darius Joyce MRN: [id number] Date of Birth: [birth year]  Darius Joyce presents today for Blood Pressure recheck with clinical support staff.  Order for BP recheck by Lazaro Arms, FNP-C, ordered on 08/26/2022.   BP Readings from Last 3 Encounters:  08/26/22 (!) 149/103  10/29/21 (!) 143/99  09/15/21 (!) 143/90    Current Outpatient Medications  Medication Sig Dispense Refill   albuterol (VENTOLIN HFA) 108 (90 Base) MCG/ACT inhaler Inhale 2 puffs into the lungs every 6 (six) hours as needed. 18 g 5   amLODipine (NORVASC) 10 MG tablet Take 1 tablet (10 mg total) by mouth daily. 90 tablet 1   fluticasone furoate-vilanterol (BREO ELLIPTA) 100-25 MCG/INH AEPB Inhale 1 puff into the lungs daily. (Patient not taking: Reported on 07/14/2021) 28 each 0   No current facility-administered medications for this visit.    Hypertensive Medication Review: Patient states that they are taking all their hypertensive medications as prescribed and their last dose of hypertensive medications was this morning   Documentation of any medication adherence discrepancies: none  Provider Recommendation:  Spoke to Darius Joyce to [place] being out of the office today and they stated: to follow up with her in two weeks   Patient has been scheduled to follow up with 11/25/22 with Lazaro Arms.  . Patient has been given provider's recommendations and does not have any questions or concerns at this time. Patient will contact the office for any future questions or concerns.

## 2022-09-25 ENCOUNTER — Ambulatory Visit (INDEPENDENT_AMBULATORY_CARE_PROVIDER_SITE_OTHER): Payer: Self-pay | Admitting: Nurse Practitioner

## 2022-09-25 ENCOUNTER — Encounter: Payer: Self-pay | Admitting: Nurse Practitioner

## 2022-09-25 VITALS — BP 123/75 | HR 87 | Temp 98.4°F | Wt 376.0 lb

## 2022-09-25 DIAGNOSIS — R21 Rash and other nonspecific skin eruption: Secondary | ICD-10-CM

## 2022-09-25 DIAGNOSIS — I10 Essential (primary) hypertension: Secondary | ICD-10-CM

## 2022-09-25 MED ORDER — BLOOD PRESSURE KIT: 1.0000 [IU] | PACK | Freq: Every day | 0 refills | Status: DC

## 2022-09-25 MED ORDER — PREDNISONE 20 MG PO TABS: 20.0000 mg | ORAL_TABLET | Freq: Every day | ORAL | 0 refills | Status: AC

## 2022-09-25 MED ORDER — TRIAMCINOLONE ACETONIDE 0.1 % EX CREA: 1.0000 | TOPICAL_CREAM | Freq: Two times a day (BID) | CUTANEOUS | 0 refills | Status: DC

## 2022-09-25 NOTE — Assessment & Plan Note (Signed)
-   Blood Pressure KIT; 1 Units by Does not apply route daily.  Dispense: 1 kit; Refill: 0 - CBC - Comprehensive metabolic panel  2. Rash  - predniSONE (DELTASONE) 20 MG tablet; Take 1 tablet (20 mg total) by mouth daily with breakfast for 5 days.  Dispense: 5 tablet; Refill: 0 - triamcinolone cream (KENALOG) 0.1 %; Apply 1 Application topically 2 (two) times daily.  Dispense: 30 g; Refill: 0  Follow up:  Follow up in 3 months

## 2022-09-25 NOTE — Patient Instructions (Signed)
1. Hypertension, unspecified type  - Blood Pressure KIT; 1 Units by Does not apply route daily.  Dispense: 1 kit; Refill: 0 - CBC - Comprehensive metabolic panel  2. Rash  - predniSONE (DELTASONE) 20 MG tablet; Take 1 tablet (20 mg total) by mouth daily with breakfast for 5 days.  Dispense: 5 tablet; Refill: 0 - triamcinolone cream (KENALOG) 0.1 %; Apply 1 Application topically 2 (two) times daily.  Dispense: 30 g; Refill: 0  Follow up:  Follow up in 3 months

## 2022-09-25 NOTE — Progress Notes (Signed)
@Patient  ID: Darius Joyce, male    DOB: [birth year], 37 y.o.   MRN: [id number]  Chief Complaint  Patient presents with   Hypertension    Med taken today    Referring provider: Fenton Foy, NP   HPI  Patient presents today for a follow-up visit.  Overall he has been doing well.  He states that he has been compliant with medications.  Patient would like a blood pressure cuff ordered so he can check his blood pressure regularly at home.  Patient also complains today of a rash to his upper extremities and abdomen.  He believes that this is from fleabites from his dog.  We will order Kenalog cream and a round of prednisone. Denies f/c/s, n/v/d, hemoptysis, PND, leg swelling Denies chest pain or edema       No Known Allergies   There is no immunization history on file for this patient.  Past Medical History:  Diagnosis Date   Hilar adenopathy 12/23/2020    Tobacco History: Social History   Tobacco Use  Smoking Status Never  Smokeless Tobacco Never   Counseling given: Not Answered   Outpatient Encounter Medications as of 09/25/2022  Medication Sig   Blood Pressure KIT 1 Units by Does not apply route daily.   predniSONE (DELTASONE) 20 MG tablet Take 1 tablet (20 mg total) by mouth daily with breakfast for 5 days.   triamcinolone cream (KENALOG) 0.1 % Apply 1 Application topically 2 (two) times daily.   albuterol (VENTOLIN HFA) 108 (90 Base) MCG/ACT inhaler Inhale 2 puffs into the lungs every 6 (six) hours as needed.   amLODipine (NORVASC) 10 MG tablet Take 1 tablet (10 mg total) by mouth daily.   fluticasone furoate-vilanterol (BREO ELLIPTA) 100-25 MCG/INH AEPB Inhale 1 puff into the lungs daily. (Patient not taking: Reported on 07/14/2021)   No facility-administered encounter medications on file as of 09/25/2022.     Review of Systems  Review of Systems  Constitutional: Negative.   HENT: Negative.    Cardiovascular: Negative.   Gastrointestinal: Negative.    Skin:  Positive for rash.  Allergic/Immunologic: Negative.   Neurological: Negative.   Psychiatric/Behavioral: Negative.         Physical Exam  BP 123/75   Pulse 87   Temp 98.4 F (36.9 C)   Wt (!) 376 lb (170.6 kg)   SpO2 97%   BMI 53.95 kg/m   Wt Readings from Last 5 Encounters:  09/25/22 (!) 376 lb (170.6 kg)  09/09/22 (!) 375 lb (170.1 kg)  08/26/22 (!) 375 lb 3.2 oz (170.2 kg)  10/29/21 (!) 368 lb 2 oz (167 kg)  08/27/21 (!) 355 lb 0.6 oz (161 kg)     Physical Exam Vitals and nursing note reviewed.  Constitutional:      General: He is not in acute distress.    Appearance: He is well-developed.  Cardiovascular:     Rate and Rhythm: Normal rate and regular rhythm.  Pulmonary:     Effort: Pulmonary effort is normal.     Breath sounds: Normal breath sounds.  Skin:    General: Skin is warm and dry.     Findings: Rash present.  Neurological:     Mental Status: He is alert and oriented to person, place, and time.      Lab Results:  CBC    Component Value Date/Time   WBC 6.5 08/26/2022 0959   WBC 6.0 12/24/2020 0046   RBC 4.94 08/26/2022 0959   RBC  4.73 12/24/2020 0046   HGB 12.8 (L) 08/26/2022 0959   HCT 39.5 08/26/2022 0959   PLT 301 08/26/2022 0959   MCV 80 08/26/2022 0959   MCH 25.9 (L) 08/26/2022 0959   MCH 28.1 12/24/2020 0046   MCHC 32.4 08/26/2022 0959   MCHC 33.5 12/24/2020 0046   RDW 15.2 08/26/2022 0959   LYMPHSABS 1.2 12/22/2020 1313   MONOABS 1.2 (H) 12/22/2020 1313   EOSABS 0.1 12/22/2020 1313   BASOSABS 0.0 12/22/2020 1313    BMET    Component Value Date/Time   NA 141 08/26/2022 0959   K 3.7 08/26/2022 0959   CL 102 08/26/2022 0959   CO2 25 08/26/2022 0959   GLUCOSE 104 (H) 08/26/2022 0959   GLUCOSE 94 12/24/2020 0046   BUN 11 08/26/2022 0959   CREATININE 0.98 08/26/2022 0959   CALCIUM 9.3 08/26/2022 0959   GFRNONAA >60 12/24/2020 0046    BNP No results found for: "BNP"  ProBNP No results found for:  "PROBNP"  Imaging: No results found.   Assessment & Plan:   Hypertension - Blood Pressure KIT; 1 Units by Does not apply route daily.  Dispense: 1 kit; Refill: 0 - CBC - Comprehensive metabolic panel  2. Rash  - predniSONE (DELTASONE) 20 MG tablet; Take 1 tablet (20 mg total) by mouth daily with breakfast for 5 days.  Dispense: 5 tablet; Refill: 0 - triamcinolone cream (KENALOG) 0.1 %; Apply 1 Application topically 2 (two) times daily.  Dispense: 30 g; Refill: 0  Follow up:  Follow up in 3 months     Fenton Foy, NP 09/25/2022

## 2022-09-26 LAB — COMPREHENSIVE METABOLIC PANEL
ALT: 20 IU/L (ref 0–44)
AST: 25 IU/L (ref 0–40)
Albumin/Globulin Ratio: 0.9 — ABNORMAL LOW (ref 1.2–2.2)
Albumin: 3.9 g/dL — ABNORMAL LOW (ref 4.1–5.1)
Alkaline Phosphatase: 100 IU/L (ref 44–121)
BUN/Creatinine Ratio: 11 (ref 9–20)
BUN: 10 mg/dL (ref 6–20)
Bilirubin Total: 0.4 mg/dL (ref 0.0–1.2)
CO2: 25 mmol/L (ref 20–29)
Calcium: 8.9 mg/dL (ref 8.7–10.2)
Chloride: 100 mmol/L (ref 96–106)
Creatinine, Ser: 0.88 mg/dL (ref 0.76–1.27)
Globulin, Total: 4.2 g/dL (ref 1.5–4.5)
Glucose: 98 mg/dL (ref 70–99)
Potassium: 4 mmol/L (ref 3.5–5.2)
Sodium: 138 mmol/L (ref 134–144)
Total Protein: 8.1 g/dL (ref 6.0–8.5)
eGFR: 114 mL/min/{1.73_m2} (ref >59)

## 2022-09-26 LAB — CBC
Hematocrit: 42.9 % (ref 37.5–51.0)
Hemoglobin: 13.6 g/dL (ref 13.0–17.7)
MCH: 26.1 pg — ABNORMAL LOW (ref 26.6–33.0)
MCHC: 31.7 g/dL (ref 31.5–35.7)
MCV: 82 fL (ref 79–97)
Platelets: 298 10*3/uL (ref 150–450)
RBC: 5.22 x10E6/uL (ref 4.14–5.80)
RDW: 15.2 % (ref 11.6–15.4)
WBC: 6.6 10*3/uL (ref 3.4–10.8)

## 2022-11-25 ENCOUNTER — Ambulatory Visit (INDEPENDENT_AMBULATORY_CARE_PROVIDER_SITE_OTHER): Payer: Self-pay | Admitting: Nurse Practitioner

## 2022-11-25 ENCOUNTER — Encounter: Payer: Self-pay | Admitting: Nurse Practitioner

## 2022-11-25 DIAGNOSIS — I10 Essential (primary) hypertension: Secondary | ICD-10-CM

## 2022-11-25 MED ORDER — AMLODIPINE BESYLATE 10 MG PO TABS: 10.0000 mg | ORAL_TABLET | Freq: Every day | ORAL | 2 refills | Status: DC

## 2022-11-25 NOTE — Progress Notes (Signed)
@Patient  ID: Darius Joyce, male    DOB: 1985-12-11, 37 y.o.   MRN: [id number]  Chief Complaint  Patient presents with   Hypertension    Follow up    Referring provider: Ivonne Andrew, NP  HPI   Patient presents today for a follow-up visit.  Overall he has been doing well.  He states that he has been compliant with medications.    Patient would like a blood pressure cuff ordered so he can check his blood pressure regularly at home.      Denies f/c/s, n/v/d, hemoptysis, PND, leg swelling Denies chest pain or edema     No Known Allergies   There is no immunization history on file for this patient.  Past Medical History:  Diagnosis Date   Hilar adenopathy 12/23/2020    Tobacco History: Social History   Tobacco Use  Smoking Status Never  Smokeless Tobacco Never   Counseling given: Not Answered   Outpatient Encounter Medications as of 11/25/2022  Medication Sig   triamcinolone cream (KENALOG) 0.1 % Apply 1 Application topically 2 (two) times daily.   [DISCONTINUED] amLODipine (NORVASC) 10 MG tablet Take 1 tablet (10 mg total) by mouth daily.   albuterol (VENTOLIN HFA) 108 (90 Base) MCG/ACT inhaler Inhale 2 puffs into the lungs every 6 (six) hours as needed. (Patient not taking: Reported on 11/25/2022)   amLODipine (NORVASC) 10 MG tablet Take 1 tablet (10 mg total) by mouth daily.   Blood Pressure KIT 1 Units by Does not apply route daily. (Patient not taking: Reported on 11/25/2022)   fluticasone furoate-vilanterol (BREO ELLIPTA) 100-25 MCG/INH AEPB Inhale 1 puff into the lungs daily. (Patient not taking: Reported on 07/14/2021)   No facility-administered encounter medications on file as of 11/25/2022.     Review of Systems  Review of Systems  Constitutional: Negative.   HENT: Negative.    Cardiovascular: Negative.   Gastrointestinal: Negative.   Allergic/Immunologic: Negative.   Neurological: Negative.   Psychiatric/Behavioral: Negative.          Physical Exam  BP 130/72   Pulse 96   Wt (!) 385 lb 3.2 oz (174.7 kg)   SpO2 97%   BMI 55.27 kg/m   Wt Readings from Last 5 Encounters:  11/25/22 (!) 385 lb 3.2 oz (174.7 kg)  09/25/22 (!) 376 lb (170.6 kg)  09/09/22 (!) 375 lb (170.1 kg)  08/26/22 (!) 375 lb 3.2 oz (170.2 kg)  10/29/21 (!) 368 lb 2 oz (167 kg)     Physical Exam Vitals and nursing note reviewed.  Constitutional:      General: He is not in acute distress.    Appearance: He is well-developed.  Cardiovascular:     Rate and Rhythm: Normal rate and regular rhythm.  Pulmonary:     Effort: Pulmonary effort is normal.     Breath sounds: Normal breath sounds.  Skin:    General: Skin is warm and dry.  Neurological:     Mental Status: He is alert and oriented to person, place, and time.      Lab Results:  CBC    Component Value Date/Time   WBC 6.6 09/25/2022 1044   WBC 6.0 12/24/2020 0046   RBC 5.22 09/25/2022 1044   RBC 4.73 12/24/2020 0046   HGB 13.6 09/25/2022 1044   HCT 42.9 09/25/2022 1044   PLT 298 09/25/2022 1044   MCV 82 09/25/2022 1044   MCH 26.1 (L) 09/25/2022 1044   MCH 28.1 12/24/2020 0046  MCHC 31.7 09/25/2022 1044   MCHC 33.5 12/24/2020 0046   RDW 15.2 09/25/2022 1044   LYMPHSABS 1.2 12/22/2020 1313   MONOABS 1.2 (H) 12/22/2020 1313   EOSABS 0.1 12/22/2020 1313   BASOSABS 0.0 12/22/2020 1313    BMET    Component Value Date/Time   NA 138 09/25/2022 1044   K 4.0 09/25/2022 1044   CL 100 09/25/2022 1044   CO2 25 09/25/2022 1044   GLUCOSE 98 09/25/2022 1044   GLUCOSE 94 12/24/2020 0046   BUN 10 09/25/2022 1044   CREATININE 0.88 09/25/2022 1044   CALCIUM 8.9 09/25/2022 1044   GFRNONAA >60 12/24/2020 0046      Assessment & Plan:   Hypertension - amLODipine (NORVASC) 10 MG tablet; Take 1 tablet (10 mg total) by mouth daily.  Dispense: 90 tablet; Refill: 2   Follow up:  Follow up in 6 months or sooner if needed     Ivonne Andrew, NP 11/25/2022

## 2022-11-25 NOTE — Assessment & Plan Note (Signed)
-   amLODipine (NORVASC) 10 MG tablet; Take 1 tablet (10 mg total) by mouth daily.  Dispense: 90 tablet; Refill: 2   Follow up:  Follow up in 6 months or sooner if needed 

## 2022-11-25 NOTE — Patient Instructions (Signed)
1. Hypertension, unspecified type  - amLODipine (NORVASC) 10 MG tablet; Take 1 tablet (10 mg total) by mouth daily.  Dispense: 90 tablet; Refill: 2   Follow up:  Follow up in 6 months or sooner if needed

## 2023-05-28 ENCOUNTER — Ambulatory Visit: Payer: Self-pay | Admitting: Nurse Practitioner

## 2023-06-16 ENCOUNTER — Encounter: Payer: Self-pay | Admitting: Nurse Practitioner

## 2023-06-16 ENCOUNTER — Ambulatory Visit (INDEPENDENT_AMBULATORY_CARE_PROVIDER_SITE_OTHER): Payer: Self-pay | Admitting: Nurse Practitioner

## 2023-06-16 VITALS — BP 148/62 | HR 93 | Temp 97.2°F | Wt >= 6400 oz

## 2023-06-16 DIAGNOSIS — I10 Essential (primary) hypertension: Secondary | ICD-10-CM

## 2023-06-16 DIAGNOSIS — E119 Type 2 diabetes mellitus without complications: Secondary | ICD-10-CM

## 2023-06-16 DIAGNOSIS — R7303 Prediabetes: Secondary | ICD-10-CM

## 2023-06-16 MED ORDER — AMLODIPINE BESYLATE 10 MG PO TABS: 10.0000 mg | ORAL_TABLET | Freq: Every day | ORAL | 2 refills | Status: DC

## 2023-06-16 NOTE — Progress Notes (Unsigned)
Subjective   Patient ID: Darius Joyce, male    DOB: 02/18/1986, 37 y.o.   MRN: [id number]  Chief Complaint  Patient presents with   Follow-up    6 month follow up    Referring provider: Ivonne Andrew, NP  Darius Joyce is a 37 y.o. male with Past Medical History: 12/23/2020: Hilar adenopathy   HPI  Patient presents today for follow-up visit.  Overall he has been doing well.  We did discuss starting him on Wegovy or Ozempic for weight loss.  A1c is elevated at 7.0.  We will consult with pharmacy for medication management and weight loss. Denies f/c/s, n/v/d, hemoptysis, PND, leg swelling Denies chest pain or edema      No Known Allergies   There is no immunization history on file for this patient.  Tobacco History: Social History   Tobacco Use  Smoking Status Never  Smokeless Tobacco Never   Counseling given: Not Answered   Outpatient Encounter Medications as of 06/16/2023  Medication Sig   triamcinolone cream (KENALOG) 0.1 % Apply 1 Application topically 2 (two) times daily.   [DISCONTINUED] amLODipine (NORVASC) 10 MG tablet Take 1 tablet (10 mg total) by mouth daily.   albuterol (VENTOLIN HFA) 108 (90 Base) MCG/ACT inhaler Inhale 2 puffs into the lungs every 6 (six) hours as needed. (Patient not taking: Reported on 11/25/2022)   amLODipine (NORVASC) 10 MG tablet Take 1 tablet (10 mg total) by mouth daily.   Blood Pressure KIT 1 Units by Does not apply route daily. (Patient not taking: Reported on 11/25/2022)   fluticasone furoate-vilanterol (BREO ELLIPTA) 100-25 MCG/INH AEPB Inhale 1 puff into the lungs daily. (Patient not taking: Reported on 07/14/2021)   No facility-administered encounter medications on file as of 06/16/2023.    Review of Systems  Review of Systems  Constitutional: Negative.   HENT: Negative.    Cardiovascular: Negative.   Gastrointestinal: Negative.   Allergic/Immunologic: Negative.   Neurological: Negative.    Psychiatric/Behavioral: Negative.       Objective:   BP (!) 148/62   Pulse 93   Temp (!) 97.2 F (36.2 C)   Wt (!) 411 lb (186.4 kg)   SpO2 97%   BMI 58.97 kg/m   Wt Readings from Last 5 Encounters:  06/16/23 (!) 411 lb (186.4 kg)  11/25/22 (!) 385 lb 3.2 oz (174.7 kg)  09/25/22 (!) 376 lb (170.6 kg)  09/09/22 (!) 375 lb (170.1 kg)  08/26/22 (!) 375 lb 3.2 oz (170.2 kg)     Physical Exam Vitals and nursing note reviewed.  Constitutional:      General: He is not in acute distress.    Appearance: He is well-developed.  Cardiovascular:     Rate and Rhythm: Normal rate and regular rhythm.  Pulmonary:     Effort: Pulmonary effort is normal.     Breath sounds: Normal breath sounds.  Skin:    General: Skin is warm and dry.  Neurological:     Mental Status: He is alert and oriented to person, place, and time.       Assessment & Plan:   Hypertension, unspecified type -     CBC -     Comprehensive metabolic panel -     amLODIPine Besylate; Take 1 tablet (10 mg total) by mouth daily.  Dispense: 90 tablet; Refill: 2  Prediabetes -     Hemoglobin A1c  Type 2 diabetes mellitus without complication, without long-term current use of insulin (HCC) -  AMB Referral VBCI Care Management     Return in about 3 months (around 09/14/2023) for hypertension.   Ivonne Andrew, NP 06/17/2023

## 2023-06-16 NOTE — Patient Instructions (Signed)
1. Hypertension, unspecified type  - CBC - Comprehensive metabolic panel - amLODipine (NORVASC) 10 MG tablet; Take 1 tablet (10 mg total) by mouth daily.  Dispense: 90 tablet; Refill: 2  2. Prediabetes  - Hemoglobin A1c  Follow up:  Follow up in 3 months

## 2023-06-17 LAB — CBC
Hematocrit: 42.3 % (ref 37.5–51.0)
Hemoglobin: 13.2 g/dL (ref 13.0–17.7)
MCH: 27.3 pg (ref 26.6–33.0)
MCHC: 31.2 g/dL — ABNORMAL LOW (ref 31.5–35.7)
MCV: 88 fL (ref 79–97)
Platelets: 324 10*3/uL (ref 150–450)
RBC: 4.83 x10E6/uL (ref 4.14–5.80)
RDW: 14.5 % (ref 11.6–15.4)
WBC: 7.4 10*3/uL (ref 3.4–10.8)

## 2023-06-17 LAB — COMPREHENSIVE METABOLIC PANEL
ALT: 15 [IU]/L (ref 0–44)
AST: 17 [IU]/L (ref 0–40)
Albumin: 3.7 g/dL — ABNORMAL LOW (ref 4.1–5.1)
Alkaline Phosphatase: 108 [IU]/L (ref 44–121)
BUN/Creatinine Ratio: 8 — ABNORMAL LOW (ref 9–20)
BUN: 7 mg/dL (ref 6–20)
Bilirubin Total: 0.2 mg/dL (ref 0.0–1.2)
CO2: 23 mmol/L (ref 20–29)
Calcium: 9 mg/dL (ref 8.7–10.2)
Chloride: 107 mmol/L — ABNORMAL HIGH (ref 96–106)
Creatinine, Ser: 0.93 mg/dL (ref 0.76–1.27)
Globulin, Total: 3.7 g/dL (ref 1.5–4.5)
Glucose: 99 mg/dL (ref 70–99)
Potassium: 4.3 mmol/L (ref 3.5–5.2)
Sodium: 144 mmol/L (ref 134–144)
Total Protein: 7.4 g/dL (ref 6.0–8.5)
eGFR: 108 mL/min/{1.73_m2} (ref >59)

## 2023-06-17 LAB — HEMOGLOBIN A1C
Est. average glucose Bld gHb Est-mCnc: 154 mg/dL
Hgb A1c MFr Bld: 7 % — ABNORMAL HIGH (ref 4.8–5.6)

## 2023-06-17 NOTE — Progress Notes (Signed)
My chart message was sent to pt after confirming last activity date.  KH

## 2023-06-18 ENCOUNTER — Telehealth: Payer: Self-pay

## 2023-06-18 NOTE — Progress Notes (Signed)
   Care Guide Note  06/18/2023 Name: [name] MRN: [id number] DOB: [birth year]  Referred by: Ivonne Andrew, NP Reason for referral : Care Coordination (Outreach to schedule with pharm d )   Darius Joyce is a 37 y.o. year old male who is a primary care patient of Ivonne Andrew, NP. Zafar Gianni was referred to the pharmacist for assistance related to  weightloss .    An unsuccessful telephone outreach was attempted today to contact the patient who was referred to the pharmacy team for assistance with medication assistance. Additional attempts will be made to contact the patient.   Penne Lash , RMA     [facility]  [facility], [facility] Guide  Direct Dial: [phone]  Website: [email or web address]

## 2023-06-24 NOTE — Progress Notes (Signed)
Care Guide Pharmacy Note  06/24/2023 Name: Darius Joyce MRN: [id number] DOB: 05/12/1986  Referred By: Ivonne Andrew, NP Reason for referral: Care Coordination (Outreach to schedule with pharm d )   Darius Joyce is a 37 y.o. year old male who is a primary care patient of Ivonne Andrew, NP.  Darius Joyce was referred to the pharmacist for assistance related to:  weightloss  A second unsuccessful telephone outreach was attempted today to contact the patient who was referred to the pharmacy team for assistance with medication assistance. Additional attempts will be made to contact the patient.  Penne Lash , RMA     [facility]  [facility], [facility] Guide  Direct Dial: [phone]  Website: [email or web address]

## 2023-07-01 NOTE — Progress Notes (Signed)
Care Guide Pharmacy Note  07/01/2023 Name: Darius Joyce MRN: [id number] DOB: 24-Mar-1986  Referred By: Ivonne Andrew, NP Reason for referral: Care Coordination (Outreach to schedule with pharm d )   Darius Joyce is a 37 y.o. year old male who is a primary care patient of Ivonne Andrew, NP.  Darius Joyce was referred to the pharmacist for assistance related to:  weightloss  Successful contact was made with the patient to discuss pharmacy services including being ready for the pharmacist to call at least 5 minutes before the scheduled appointment time and to have medication bottles and any blood pressure readings ready for review. The patient agreed to meet with the pharmacist via telephone visit on (date/time).07/29/2023  Penne Lash , RMA     [place]  [facility], [facility] Guide  Direct Dial: [phone]  Website: [email or web address]

## 2023-07-29 ENCOUNTER — Other Ambulatory Visit: Payer: Self-pay

## 2023-08-05 ENCOUNTER — Telehealth: Payer: Self-pay

## 2023-08-05 NOTE — Progress Notes (Signed)
   08/05/2023  Patient ID: Darius Joyce, male   DOB: [birth year], 38 y.o.   MRN: [id number]  Contacted patient regarding referral for diabetes from Ivonne Andrew, NP .   Appointment scheduled for 08/10/23 at 11:00 am. Pharmacist will outreach the patient at that time.   Harlon Flor, PharmD Clinical Pharmacist  [phone]

## 2023-08-10 ENCOUNTER — Other Ambulatory Visit: Payer: Self-pay

## 2023-08-10 DIAGNOSIS — Z79899 Other long term (current) drug therapy: Secondary | ICD-10-CM

## 2023-08-10 NOTE — Progress Notes (Signed)
 08/10/2023 Name: Darius Joyce MRN: [id number] DOB: 1986/01/26  Chief Complaint  Patient presents with   Medication Management    Hypertension, Diabetes    Darius Joyce is a 38 y.o. year old male who presented for a telephone visit.   They were referred to the pharmacist by their PCP for assistance in managing diabetes, hypertension, and weight management.    Subjective:  Care Team: Primary Care Provider: Oley Bascom RAMAN, NP ; Next Scheduled Visit: 09/15/23   Medication Access/Adherence  Current Pharmacy:  [id number] [place] - [address] [place] [address] Phone: [phone] Fax: [phone]  [facility] [address] [place] [address] Phone: [phone] Fax: [phone]   Patient reports affordability concerns with their medications: Yes  Patient reports access/transportation concerns to their pharmacy: No  Patient reports adherence concerns with their medications:  No  Per pharmacy claims data, amlodipine  last filled 11/25/22 for 90 day supply. Patient reports still has some amlodipine  remaining at home.   Diabetes:  Patient A1c up from 5.8 to 7.0. 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 Lost job a few months ago.   Current meal patterns:  - Breakfast: pancakes, sausage, eggs - Lunch: typically skips  - Supper: alfredo, baked chicken, pork chops - Snacks: apples, grapes, strawberries, pineapples - Drinks: fruit juices, kool-aid  Current physical activity: Still has a [employer], but has not exercised in the last few months due to loss of job. Prior to that was going up to 4 times per week.  Hypertension:  Current medications: amlodipine  10 mg daily. Pt non-adherent based on pharmacy claims data, he could not verify last fill date with pill bottles at this time. Reports taking amlodipine   daily.  Patient does not have a validated, automated, upper arm home BP cuff. [facility] but [facility] were [facility] of [facility]. He reports his sister lives close by and has a BP monitor that he can utilize.  Patient denies hypertensive symptoms including headache, chest pain, shortness of breath     Objective:  Lab Results  Component Value Date   HGBA1C 7.0 (H) 06/16/2023    Lab Results  Component Value Date   CREATININE 0.93 06/16/2023   BUN 7 06/16/2023   NA 144 06/16/2023   K 4.3 06/16/2023   CL 107 (H) 06/16/2023   CO2 23 06/16/2023    Lab Results  Component Value Date   CHOL 135 08/26/2022   HDL 33 (L) 08/26/2022   LDLCALC 82 08/26/2022   TRIG 108 08/26/2022   CHOLHDL 4.1 08/26/2022    Medications Reviewed Today     Reviewed by Graylon Keen, [facility] (Pharmacist) on 08/10/23 at 1137  Med List Status: <None>   Medication Order Taking? Sig Documenting Provider Last Dose Status Informant  albuterol  (VENTOLIN  HFA) 108 (90 Base) MCG/ACT inhaler [id number] No Inhale 2 puffs into the lungs every 6 (six) hours as needed.  Patient not taking: Reported on 08/10/2023   Meade Verdon RAMAN, MD Not Taking Active   amLODipine  (NORVASC ) 10 MG tablet [id number] Yes Take 1 tablet (10 mg total) by mouth daily. Oley Bascom RAMAN, NP Taking Active   Blood Pressure KIT [id number] No 1 Units by Does not apply route daily.  Patient not taking: Reported on 08/10/2023   Oley Bascom RAMAN, NP Not Taking Active   fluticasone  furoate-vilanterol (BREO ELLIPTA ) 100-25  MCG/INH AEPB [id number] No Inhale 1 puff into the lungs daily.  Patient not taking: Reported on 08/10/2023   Meade Verdon RAMAN, MD Not Taking Active   triamcinolone  cream (KENALOG ) 0.1 % [id number] No Apply 1 Application topically 2 (two) times daily.  Patient not taking: Reported on 08/10/2023   Oley Bascom RAMAN, NP Not Taking Active               Assessment/Plan:   Diabetes: - Reviewed goal A1c - Reviewed dietary  modifications including replacing sugary drinks, like kool-aid, with crystal light and replacing white pasta with healthier alternatives like spaghetti squash - Reviewed lifestyle modifications including: increasing physical activity - Patient denies personal or family history of multiple endocrine neoplasia type 2, medullary thyroid cancer; personal history of pancreatitis or gallbladder disease. - Recommend GLP-1 for A1c control and weight loss, will collaborate with PCP for initiation - Meets financial criteria for Ozempic  patient assistance program through [employer]. Will collaborate with provider, CPhT, and patient to pursue assistance.    Hypertension: - Reviewed diet and sodium intake - Discussed medication adherence  - Educated on the importance of compliance    Follow Up Plan: 4 weeks with Pharmacist  Heather Factor, PharmD Clinical Pharmacist  [phone]

## 2023-08-23 NOTE — Progress Notes (Signed)
   08/23/2023 Name: Darius Joyce MRN: [id number] DOB: 10-04-1985  Chief Complaint  Patient presents with   Medication Management    Hypertension, Diabetes    The patients has a diagnosis of hypertension and it is medically necessary for them to have access to a home device to monitor blood pressure.  The patient does not have readily available insurance access to a device and cannot afford to purchase a device at this time.  The patient has been counseled that they do not need to continue to receive services from [facility] to receive a device.  The patient will be given a device free of charge.  Heather Factor, PharmD Clinical Pharmacist  [phone]

## 2023-09-15 ENCOUNTER — Encounter: Payer: Self-pay | Admitting: Nurse Practitioner

## 2023-09-15 ENCOUNTER — Ambulatory Visit (INDEPENDENT_AMBULATORY_CARE_PROVIDER_SITE_OTHER): Payer: Self-pay | Admitting: Nurse Practitioner

## 2023-09-15 VITALS — BP 180/83 | HR 90 | Temp 98.0°F | Ht 70.0 in | Wt >= 6400 oz

## 2023-09-15 DIAGNOSIS — E66813 Obesity, class 3: Secondary | ICD-10-CM

## 2023-09-15 DIAGNOSIS — I10 Essential (primary) hypertension: Secondary | ICD-10-CM

## 2023-09-15 DIAGNOSIS — E119 Type 2 diabetes mellitus without complications: Secondary | ICD-10-CM

## 2023-09-15 DIAGNOSIS — Z6841 Body Mass Index (BMI) 40.0 and over, adult: Secondary | ICD-10-CM

## 2023-09-15 DIAGNOSIS — Z13220 Encounter for screening for lipoid disorders: Secondary | ICD-10-CM

## 2023-09-15 LAB — POCT GLYCOSYLATED HEMOGLOBIN (HGB A1C): Hemoglobin A1C: 7 % — AB (ref 4.0–5.6)

## 2023-09-15 MED ORDER — AMLODIPINE BESYLATE 10 MG PO TABS: 10.0000 mg | ORAL_TABLET | Freq: Every day | ORAL | 2 refills | Status: DC

## 2023-09-15 MED ORDER — CLONIDINE HCL 0.1 MG PO TABS: 0.1000 mg | ORAL_TABLET | Freq: Once | ORAL | Status: DC

## 2023-09-15 MED ORDER — HYDROCHLOROTHIAZIDE 12.5 MG PO TABS: 12.5000 mg | ORAL_TABLET | Freq: Every day | ORAL | 3 refills | Status: DC

## 2023-09-15 MED ORDER — SEMAGLUTIDE(0.25 OR 0.5MG/DOS) 2 MG/3ML ~~LOC~~ SOPN: 0.2500 mg | PEN_INJECTOR | SUBCUTANEOUS | 2 refills | Status: DC

## 2023-09-15 NOTE — Patient Instructions (Signed)
 1. Type 2 diabetes mellitus without complication, without long-term current use of insulin (HCC) (Primary) - Microalbumin / creatinine urine ratio - Semaglutide,0.25 or 0.5MG /DOS, 2 MG/3ML SOPN; Inject 0.25 mg into the skin once a week.  Dispense: 3 mL; Refill: 2 - POCT glycosylated hemoglobin (Hb A1C)  2. Hypertension, unspecified type  - hydrochlorothiazide (HYDRODIURIL) 12.5 MG tablet; Take 1 tablet (12.5 mg total) by mouth daily.  Dispense: 90 tablet; Refill: 3 - amLODipine (NORVASC) 10 MG tablet; Take 1 tablet (10 mg total) by mouth daily.  Dispense: 90 tablet; Refill: 2  3. Class 3 severe obesity without serious comorbidity with body mass index (BMI) of 50.0 to 59.9 in adult, unspecified obesity type (HCC)  - Amb Ref to Medical Weight Management  4. Lipid screening  - Lipid Panel

## 2023-09-15 NOTE — Progress Notes (Signed)
 Subjective   Patient ID: Darius Joyce, male    DOB: [birth year], 38 y.o.   MRN: [id number]  Chief Complaint  Patient presents with   Hypertension    Patient stated that he needs more BP medication     Referring provider: Ivonne Andrew, NP  Kyce Ging is a 38 y.o. male with Past Medical History: 12/23/2020: Hilar adenopathy   HPI  Patient presents today for follow-up visit.  Overall he has been doing well.  We did discuss starting him on Wegovy or Ozempic for weight loss.  A1c is elevated at 7.0. will place a referral for weight management today. Will start on ozempic with samples in office. Blood pressure elevated in office today. Patient has been out of medication. Will refill amlodipine and start hydrochlorothiazide. Denies f/c/s, n/v/d, hemoptysis, PND, leg swelling Denies chest pain or edema  Note: blood pressure was elevated today. Clonidine given. Advised patient to start BP meds today. Will schedule him for a follow up BP on Friday.    No Known Allergies   There is no immunization history on file for this patient.  Tobacco History: Social History   Tobacco Use  Smoking Status Never  Smokeless Tobacco Never   Counseling given: Not Answered   Outpatient Encounter Medications as of 09/15/2023  Medication Sig   hydrochlorothiazide (HYDRODIURIL) 12.5 MG tablet Take 1 tablet (12.5 mg total) by mouth daily.   Semaglutide,0.25 or 0.5MG /DOS, 2 MG/3ML SOPN Inject 0.25 mg into the skin once a week.   [DISCONTINUED] amLODipine (NORVASC) 10 MG tablet Take 1 tablet (10 mg total) by mouth daily.   albuterol (VENTOLIN HFA) 108 (90 Base) MCG/ACT inhaler Inhale 2 puffs into the lungs every 6 (six) hours as needed. (Patient not taking: Reported on 11/25/2022)   amLODipine (NORVASC) 10 MG tablet Take 1 tablet (10 mg total) by mouth daily.   Blood Pressure KIT 1 Units by Does not apply route daily. (Patient not taking: Reported on 11/25/2022)   fluticasone  furoate-vilanterol (BREO ELLIPTA) 100-25 MCG/INH AEPB Inhale 1 puff into the lungs daily. (Patient not taking: Reported on 07/14/2021)   triamcinolone cream (KENALOG) 0.1 % Apply 1 Application topically 2 (two) times daily. (Patient not taking: Reported on 09/15/2023)   Facility-Administered Encounter Medications as of 09/15/2023  Medication   cloNIDine (CATAPRES) tablet 0.1 mg    Review of Systems  Review of Systems  Constitutional: Negative.   HENT: Negative.    Cardiovascular: Negative.   Gastrointestinal: Negative.   Allergic/Immunologic: Negative.   Neurological: Negative.   Psychiatric/Behavioral: Negative.       Objective:   BP (!) 180/83   Pulse 90   Temp 98 F (36.7 C) (Oral)   Ht 5\' 10"  (1.778 m)   Wt (!) 415 lb 3.2 oz (188.3 kg)   SpO2 100%   BMI 59.57 kg/m   Wt Readings from Last 5 Encounters:  09/15/23 (!) 415 lb 3.2 oz (188.3 kg)  06/16/23 (!) 411 lb (186.4 kg)  11/25/22 (!) 385 lb 3.2 oz (174.7 kg)  09/25/22 (!) 376 lb (170.6 kg)  09/09/22 (!) 375 lb (170.1 kg)     Physical Exam Vitals and nursing note reviewed.  Constitutional:      General: He is not in acute distress.    Appearance: He is well-developed.  Cardiovascular:     Rate and Rhythm: Normal rate and regular rhythm.  Pulmonary:     Effort: Pulmonary effort is normal.     Breath sounds: Normal breath  sounds.  Skin:    General: Skin is warm and dry.  Neurological:     Mental Status: He is alert and oriented to person, place, and time.       Assessment & Plan:   Type 2 diabetes mellitus without complication, without long-term current use of insulin (HCC) -     Microalbumin / creatinine urine ratio -     Semaglutide(0.25 or 0.5MG /DOS); Inject 0.25 mg into the skin once a week.  Dispense: 3 mL; Refill: 2 -     POCT glycosylated hemoglobin (Hb A1C)  Hypertension, unspecified type -     hydroCHLOROthiazide; Take 1 tablet (12.5 mg total) by mouth daily.  Dispense: 90 tablet; Refill:  3 -     amLODIPine Besylate; Take 1 tablet (10 mg total) by mouth daily.  Dispense: 90 tablet; Refill: 2 -     cloNIDine HCl  Class 3 severe obesity without serious comorbidity with body mass index (BMI) of 50.0 to 59.9 in adult, unspecified obesity type (HCC) -     Amb Ref to Medical Weight Management  Lipid screening -     Lipid panel     Return in about 3 months (around 12/16/2023).   Ivonne Andrew, NP 09/15/2023

## 2023-09-16 LAB — MICROALBUMIN / CREATININE URINE RATIO
Creatinine, Urine: 62.9 mg/dL
Microalb/Creat Ratio: 17 mg/g{creat} (ref 0–29)
Microalbumin, Urine: 10.4 ug/mL

## 2023-09-16 LAB — LIPID PANEL
Chol/HDL Ratio: 3.6 ratio (ref 0.0–5.0)
Cholesterol, Total: 153 mg/dL (ref 100–199)
HDL: 42 mg/dL (ref >39)
LDL Chol Calc (NIH): 88 mg/dL (ref 0–99)
Triglycerides: 127 mg/dL (ref 0–149)
VLDL Cholesterol Cal: 23 mg/dL (ref 5–40)

## 2023-09-22 ENCOUNTER — Encounter (INDEPENDENT_AMBULATORY_CARE_PROVIDER_SITE_OTHER): Payer: Self-pay

## 2023-09-24 ENCOUNTER — Ambulatory Visit: Payer: Self-pay

## 2023-09-24 ENCOUNTER — Other Ambulatory Visit: Payer: Self-pay | Admitting: Nurse Practitioner

## 2023-09-24 VITALS — BP 137/71 | HR 89 | Wt >= 6400 oz

## 2023-09-24 DIAGNOSIS — I10 Essential (primary) hypertension: Secondary | ICD-10-CM

## 2023-09-24 NOTE — Progress Notes (Signed)
 Patient is here for BP recheck   Blood Pressure Recheck Visit  Name: Darius Joyce MRN: [id number] Date of Birth: 1985/09/07  Darius Joyce presents today for Blood Pressure recheck with clinical support staff.  Order for BP recheck by Angus Seller, ordered on 09/15/2023.   BP Readings from Last 3 Encounters:  09/24/23 137/71  09/15/23 (!) 180/83  06/16/23 (!) 148/62    Current Outpatient Medications  Medication Sig Dispense Refill   albuterol (VENTOLIN HFA) 108 (90 Base) MCG/ACT inhaler Inhale 2 puffs into the lungs every 6 (six) hours as needed. (Patient not taking: Reported on 11/25/2022) 18 g 5   amLODipine (NORVASC) 10 MG tablet Take 1 tablet (10 mg total) by mouth daily. 90 tablet 2   Blood Pressure KIT 1 Units by Does not apply route daily. (Patient not taking: Reported on 11/25/2022) 1 kit 0   fluticasone furoate-vilanterol (BREO ELLIPTA) 100-25 MCG/INH AEPB Inhale 1 puff into the lungs daily. (Patient not taking: Reported on 07/14/2021) 28 each 0   hydrochlorothiazide (HYDRODIURIL) 12.5 MG tablet Take 1 tablet (12.5 mg total) by mouth daily. 90 tablet 3   Semaglutide,0.25 or 0.5MG /DOS, 2 MG/3ML SOPN Inject 0.25 mg into the skin once a week. 3 mL 2   triamcinolone cream (KENALOG) 0.1 % Apply 1 Application topically 2 (two) times daily. (Patient not taking: Reported on 09/15/2023) 30 g 0   Current Facility-Administered Medications  Medication Dose Route Frequency Provider Last Rate Last Admin   cloNIDine (CATAPRES) tablet 0.1 mg  0.1 mg Oral Once         Hypertensive Medication Review: Patient states that they are taking all their hypertensive medications as prescribed and their last dose of hypertensive medications was this morning   Documentation of any medication adherence discrepancies: none  Provider Recommendation:  Spoke to [place] she stated that he is ok to leave since his BP is in range    Patient has been scheduled to follow up with Angus Seller on 12/20/2023    Patient has been given provider's recommendations and does not have any questions or concerns at this time. Patient will contact the office for any future questions or concerns.

## 2023-09-30 ENCOUNTER — Ambulatory Visit: Payer: Self-pay

## 2023-12-20 ENCOUNTER — Encounter: Payer: Self-pay | Admitting: Nurse Practitioner

## 2023-12-20 ENCOUNTER — Ambulatory Visit (INDEPENDENT_AMBULATORY_CARE_PROVIDER_SITE_OTHER): Payer: Self-pay | Admitting: Nurse Practitioner

## 2023-12-20 VITALS — BP 145/91 | HR 81 | Temp 98.4°F | Wt 391.0 lb

## 2023-12-20 DIAGNOSIS — E119 Type 2 diabetes mellitus without complications: Secondary | ICD-10-CM | POA: Diagnosis not present

## 2023-12-20 DIAGNOSIS — I10 Essential (primary) hypertension: Secondary | ICD-10-CM

## 2023-12-20 DIAGNOSIS — Z13220 Encounter for screening for lipoid disorders: Secondary | ICD-10-CM | POA: Diagnosis not present

## 2023-12-20 LAB — POCT GLYCOSYLATED HEMOGLOBIN (HGB A1C): Hemoglobin A1C: 5.9 % — AB (ref 4.0–5.6)

## 2023-12-20 MED ORDER — HYDROCHLOROTHIAZIDE 12.5 MG PO TABS: 12.5000 mg | ORAL_TABLET | Freq: Every day | ORAL | 3 refills | Status: DC

## 2023-12-20 MED ORDER — AMLODIPINE BESYLATE 10 MG PO TABS
10.0000 mg | ORAL_TABLET | Freq: Every day | ORAL | 2 refills | Status: DC
Start: 2023-12-20 — End: 2024-03-27

## 2023-12-20 NOTE — Progress Notes (Signed)
 Subjective   Patient ID: Darius Joyce, male    DOB: [birth year], 38 y.o.   MRN: [id number]  Chief Complaint  Patient presents with   Medical Management of Chronic Issues    Referring provider: Jerrlyn Morel, NP  Darius Joyce is a 38 y.o. male with Past Medical History: 12/23/2020: Hilar adenopathy   HPI  Patient presents today for follow-up visit.  Overall he has been doing well.  We did discuss starting him on Wegovy  or Ozempic  for weight loss.  A1c is elevated at 5.9.  Blood pressure elevated in office today. Patient has been out of medication. Will refill amlodipine  and start hydrochlorothiazide . Denies f/c/s, n/v/d, hemoptysis, PND, leg swelling. Denies chest pain or edema.   No Known Allergies   There is no immunization history on file for this patient.  Tobacco History: Social History   Tobacco Use  Smoking Status Never  Smokeless Tobacco Never   Counseling given: Not Answered   Outpatient Encounter Medications as of 12/20/2023  Medication Sig   Blood Pressure KIT 1 Units by Does not apply route daily.   [DISCONTINUED] amLODipine  (NORVASC ) 10 MG tablet Take 1 tablet (10 mg total) by mouth daily.   albuterol  (VENTOLIN  HFA) 108 (90 Base) MCG/ACT inhaler Inhale 2 puffs into the lungs every 6 (six) hours as needed. (Patient not taking: Reported on 11/25/2022)   amLODipine  (NORVASC ) 10 MG tablet Take 1 tablet (10 mg total) by mouth daily.   fluticasone  furoate-vilanterol (BREO ELLIPTA ) 100-25 MCG/INH AEPB Inhale 1 puff into the lungs daily. (Patient not taking: Reported on 07/14/2021)   hydrochlorothiazide  (HYDRODIURIL ) 12.5 MG tablet Take 1 tablet (12.5 mg total) by mouth daily.   Semaglutide ,0.25 or 0.5MG /DOS, 2 MG/3ML SOPN Inject 0.25 mg into the skin once a week.   triamcinolone  cream (KENALOG ) 0.1 % Apply 1 Application topically 2 (two) times daily. (Patient not taking: Reported on 09/15/2023)   [DISCONTINUED] hydrochlorothiazide  (HYDRODIURIL ) 12.5 MG tablet  Take 1 tablet (12.5 mg total) by mouth daily.   Facility-Administered Encounter Medications as of 12/20/2023  Medication   cloNIDine  (CATAPRES ) tablet 0.1 mg    Review of Systems  Review of Systems  Constitutional: Negative.   HENT: Negative.    Cardiovascular: Negative.   Gastrointestinal: Negative.   Allergic/Immunologic: Negative.   Neurological: Negative.   Psychiatric/Behavioral: Negative.       Objective:   BP (!) 160/88   Pulse 81   Temp 98.4 F (36.9 C) (Oral)   Wt (!) 391 lb (177.4 kg)   SpO2 99%   BMI 56.10 kg/m   Wt Readings from Last 5 Encounters:  12/20/23 (!) 391 lb (177.4 kg)  09/24/23 (!) 415 lb (188.2 kg)  09/15/23 (!) 415 lb 3.2 oz (188.3 kg)  06/16/23 (!) 411 lb (186.4 kg)  11/25/22 (!) 385 lb 3.2 oz (174.7 kg)     Physical Exam Vitals and nursing note reviewed.  Constitutional:      General: He is not in acute distress.    Appearance: He is well-developed.   Cardiovascular:     Rate and Rhythm: Normal rate and regular rhythm.  Pulmonary:     Effort: Pulmonary effort is normal.     Breath sounds: Normal breath sounds.   Skin:    General: Skin is warm and dry.   Neurological:     Mental Status: He is alert and oriented to person, place, and time.       Assessment & Plan:   Type 2 diabetes mellitus  without complication, without long-term current use of insulin (HCC) -     AMB Referral VBCI Care Management  Hypertension, unspecified type -     amLODIPine  Besylate; Take 1 tablet (10 mg total) by mouth daily.  Dispense: 90 tablet; Refill: 2 -     hydroCHLOROthiazide ; Take 1 tablet (12.5 mg total) by mouth daily.  Dispense: 90 tablet; Refill: 3 -     CBC -     Comprehensive metabolic panel with GFR  Lipid screening -     Lipid panel     Return in about 3 months (around 03/21/2024).   Jerrlyn Morel, NP 12/20/2023

## 2023-12-20 NOTE — Addendum Note (Signed)
 Addended by: Ammon Bales D on: 12/20/2023 11:16 AM   Modules accepted: Orders

## 2023-12-20 NOTE — Patient Instructions (Signed)
 1. Hypertension, unspecified type  - amLODipine  (NORVASC ) 10 MG tablet; Take 1 tablet (10 mg total) by mouth daily.  Dispense: 90 tablet; Refill: 2 - hydrochlorothiazide  (HYDRODIURIL ) 12.5 MG tablet; Take 1 tablet (12.5 mg total) by mouth daily.  Dispense: 90 tablet; Refill: 3 - CBC - Comprehensive metabolic panel with GFR  2. Type 2 diabetes mellitus without complication, without long-term current use of insulin (HCC) (Primary)  - AMB Referral VBCI Care Management  3. Lipid screening  - Lipid Panel

## 2023-12-21 LAB — CBC
Hematocrit: 44.5 % (ref 37.5–51.0)
Hemoglobin: 14 g/dL (ref 13.0–17.7)
MCH: 27.8 pg (ref 26.6–33.0)
MCHC: 31.5 g/dL (ref 31.5–35.7)
MCV: 88 fL (ref 79–97)
Platelets: 306 10*3/uL (ref 150–450)
RBC: 5.04 x10E6/uL (ref 4.14–5.80)
RDW: 14.4 % (ref 11.6–15.4)
WBC: 6.4 10*3/uL (ref 3.4–10.8)

## 2023-12-21 LAB — COMPREHENSIVE METABOLIC PANEL WITH GFR
ALT: 13 IU/L (ref 0–44)
AST: 14 IU/L (ref 0–40)
Albumin: 3.8 g/dL — ABNORMAL LOW (ref 4.1–5.1)
Alkaline Phosphatase: 111 IU/L (ref 44–121)
BUN/Creatinine Ratio: 13 (ref 9–20)
BUN: 10 mg/dL (ref 6–20)
Bilirubin Total: 0.3 mg/dL (ref 0.0–1.2)
CO2: 22 mmol/L (ref 20–29)
Calcium: 8.6 mg/dL — ABNORMAL LOW (ref 8.7–10.2)
Chloride: 105 mmol/L (ref 96–106)
Creatinine, Ser: 0.78 mg/dL (ref 0.76–1.27)
Globulin, Total: 3.2 g/dL (ref 1.5–4.5)
Glucose: 106 mg/dL — ABNORMAL HIGH (ref 70–99)
Potassium: 4.3 mmol/L (ref 3.5–5.2)
Sodium: 141 mmol/L (ref 134–144)
Total Protein: 7 g/dL (ref 6.0–8.5)
eGFR: 117 mL/min/{1.73_m2} (ref >59)

## 2023-12-21 LAB — LIPID PANEL
Chol/HDL Ratio: 3.9 ratio (ref 0.0–5.0)
Cholesterol, Total: 149 mg/dL (ref 100–199)
HDL: 38 mg/dL — ABNORMAL LOW (ref >39)
LDL Chol Calc (NIH): 91 mg/dL (ref 0–99)
Triglycerides: 110 mg/dL (ref 0–149)
VLDL Cholesterol Cal: 20 mg/dL (ref 5–40)

## 2023-12-22 ENCOUNTER — Ambulatory Visit: Payer: Self-pay | Admitting: Nurse Practitioner

## 2023-12-30 ENCOUNTER — Telehealth: Payer: Self-pay | Admitting: *Deleted

## 2023-12-30 NOTE — Progress Notes (Addendum)
 Care Guide Pharmacy Note  12/30/2023 Name: Darius Joyce MRN: [id number] DOB: 06-Jun-1986  Referred By: Oley Bascom RAMAN, NP Reason for referral: Complex Care Management (Initial outreach to schedule referral with Pharmacy )   Darius Joyce is a 38 y.o. year old male who is a primary care patient of Oley Bascom RAMAN, NP.  Darius Joyce was referred to the pharmacist for assistance related to: DMII  An unsuccessful telephone outreach was attempted today to contact the patient who was referred to the pharmacy team for assistance with medication assistance. Additional attempts will be made to contact the patient.  Harlene Satterfield  [facility]  [facility], [facility] Guide  Direct Dial: [phone]  Fax [phone]

## 2024-01-05 NOTE — Progress Notes (Addendum)
 Care Guide Pharmacy Note  01/05/2024 Name: Darius Joyce MRN: [id number] DOB: 1986/04/13  Referred By: Oley Bascom RAMAN, NP Reason for referral: Complex Care Management (Outreach to schedule referral with Pharmacy )   Darius Joyce is a 38 y.o. year old male who is a primary care patient of Oley Bascom RAMAN, NP.  Darius Joyce was referred to the pharmacist for assistance related to: DMII  Successful contact was made with the patient to discuss pharmacy services including being ready for the pharmacist to call at least 5 minutes before the scheduled appointment time and to have medication bottles and any blood pressure readings ready for review. The patient agreed to meet with the pharmacist via telephone visit on (date/time).02/15/24 at 9:00 AM   Harlene [facility]  [facility], [facility] Guide  Direct Dial: [phone]  Fax [phone]

## 2024-02-15 ENCOUNTER — Other Ambulatory Visit (HOSPITAL_COMMUNITY): Payer: Self-pay

## 2024-02-15 ENCOUNTER — Other Ambulatory Visit (INDEPENDENT_AMBULATORY_CARE_PROVIDER_SITE_OTHER): Payer: Self-pay

## 2024-02-15 DIAGNOSIS — E1165 Type 2 diabetes mellitus with hyperglycemia: Secondary | ICD-10-CM

## 2024-02-15 NOTE — Progress Notes (Signed)
 02/15/2024 Name: Darius Joyce MRN: [id number] DOB: 1985/09/08  Chief Complaint  Patient presents with   Diabetes   Hypertension    Darius Joyce is a 38 y.o. year old male who presented for a telephone visit.   They were referred to the pharmacist by their PCP for assistance in managing diabetes, hypertension, and medication access. PMH includes HTN, CHF, pulmonary sarcoidosis, marijuana use.    Subjective: Patient was last seen by PCP, Darius Borer, NP on 12/20/23. At last visit, patient reported doing well. A1C improved from 7.0% to 5.9% on Ozempic . His BP was elevated to 145/91 mmHg, but he reported being out of his blood pressure medication. He was instructed to restart amlodipine  and add hydrochlorothiazide .   Today, patient reports that he is doing well. He is taking his BP medications. He reports that he was given a sample of Ozempic  back in April, but never picked it up at the pharmacy due to cost. He also reports that he had some changes in his insurance coverage around that time. He now has insurance through his work. Appears PA is required for coverage of Ozempic .    Care Team: Primary Care Provider: Borer Darius RAMAN, NP ; Next Scheduled Visit: 03/22/24   Medication Access/Adherence  Current Pharmacy:  [id number] [address], [place] - [address] [place] [address] Phone: [phone] Fax: [phone]  [facility] [address] [place] [address] Phone: [phone] Fax: [phone]   Patient reports affordability concerns with their medications: Yes  - has not been able to pick up Ozempic  (PA required) Patient reports access/transportation concerns to their pharmacy: No  Patient reports adherence concerns with their medications:  Yes  - went a period without his BP medications, now taking daily   Diabetes:  Current medications: Ozempic  0.25 mg weekly (has been on medication  list, but he has not take since he completed a sample pen provided by [facility] ~April - does not recall any AE)  Previous medications: N/A  Current glucose readings: not checking  Patient denies hypoglycemic s/sx including dizziness, shakiness, sweating. Patient denies hyperglycemic symptoms including polyuria, polydipsia, polyphagia, nocturia, neuropathy, blurred vision.  Current meal patterns: Reports his appetite is normal - eating 2-3 meals/day.  - Drinks: water, occasional juice or soda  Hypertension:  Current medications: amlodipine  10 mg daily, hydrochlorothiazide  12.5 mg daily  Patient does not have a validated, automated, upper arm home BP cuff -  Current blood pressure readings readings: N/A  Patient denies hypotensive s/sx including dizziness, lightheadedness.  Patient denies hypertensive symptoms including headache, chest pain, shortness of breath  Current meal patterns:  - Reports that he has tried to cut back on salt intake.  - Denies caffeine intake - occasional soda - Denies use of nicotine products - Reports drinking plenty of water throughout the day.   Current physical activity: Walking at work .  Hyperlipidemia/ASCVD Risk Reduction  Current lipid lowering medications: none Medications tried in the past: N/A   Clinical ASCVD: No  The ASCVD Risk score (Arnett DK, et al., 2019) failed to calculate for the following reasons:   The 2019 ASCVD risk score is only valid for ages 31 to 6    Objective:  BP Readings from Last 3 Encounters:  12/20/23 (!) 145/91  09/24/23 137/71  09/15/23 (!) 180/83    Lab Results  Component Value Date   HGBA1C 5.9 (A) 12/20/2023   HGBA1C 7.0 (A) 09/15/2023  HGBA1C 7.0 (H) 06/16/2023       Latest Ref Rng & Units 12/20/2023   11:06 AM 06/16/2023    3:27 PM 09/25/2022   10:44 AM  BMP  Glucose 70 - 99 mg/dL 893  99  98   BUN 6 - 20 mg/dL 10  7  10    Creatinine 0.76 - 1.27 mg/dL 9.21  9.06  9.11   BUN/Creat Ratio 9 - 20  13  8  11    Sodium 134 - 144 mmol/L 141  144  138   Potassium 3.5 - 5.2 mmol/L 4.3  4.3  4.0   Chloride 96 - 106 mmol/L 105  107  100   CO2 20 - 29 mmol/L 22  23  25    Calcium 8.7 - 10.2 mg/dL 8.6  9.0  8.9     Lab Results  Component Value Date   CHOL 149 12/20/2023   HDL 38 (L) 12/20/2023   LDLCALC 91 12/20/2023   TRIG 110 12/20/2023   CHOLHDL 3.9 12/20/2023    Medications Reviewed Today     Reviewed by Brinda Lorain SQUIBB, RPH (Pharmacist) on 02/15/24 at 0919  Med List Status: <None>   Medication Order Taking? Sig Documenting Provider Last Dose Status Informant  albuterol  (VENTOLIN  HFA) 108 (90 Base) MCG/ACT inhaler [id number]  Inhale 2 puffs into the lungs every 6 (six) hours as needed.  Patient not taking: Reported on 11/25/2022   Desai, Nikita S, MD  Active   amLODipine  (NORVASC ) 10 MG tablet [id number] Yes Take 1 tablet (10 mg total) by mouth daily. Oley Darius RAMAN, NP  Active     Discontinued 02/15/24 0919 (Cost of medication)      Discontinued 02/15/24 0919 (Completed Course)   fluticasone  furoate-vilanterol (BREO ELLIPTA ) 100-25 MCG/INH AEPB [id number]  Inhale 1 puff into the lungs daily.  Patient not taking: Reported on 07/14/2021   Desai, Nikita S, MD  Active   hydrochlorothiazide  (HYDRODIURIL ) 12.5 MG tablet [id number] Yes Take 1 tablet (12.5 mg total) by mouth daily. Oley Darius RAMAN, NP  Active   Semaglutide ,0.25 or 0.5MG /DOS, 2 MG/3ML SOPN [id number]  Inject 0.25 mg into the skin once a week.  Patient not taking: Reported on 02/15/2024   Oley Darius RAMAN, NP  Active    Patient not taking:   Discontinued 02/15/24 0919 (Expired Prescription)               Assessment/Plan:   Diabetes: - Currently controlled with most recent A1C of 5.9% below goal <7% with diet and lifestyle alone. Patient is a good candidate for a GLP-1RA or GLP-1/GIP RA to prevent progression of diabetes and manage metabolic syndrome, with BMI > 50. Patient would be willing to start this medication if  the cost is ~$25/mo through insurance.  - Last UACR March 2025 - 17 mg/g - Patient denies personal or family history of multiple endocrine neoplasia type 2, medullary thyroid cancer; personal history of pancreatitis or gallbladder disease. - Reviewed long term cardiovascular and renal outcomes of uncontrolled blood sugar - Reviewed goal A1c, goal fasting, and goal 2 hour post prandial glucose - Reviewed dietary modifications including  utilizing the healthy plate method, limiting portion size of carbohydrate foods, increasing intake of protein and non-starchy vegetables. Counseled patient to stay hydrated with water throughout the day. - Reviewed lifestyle modifications including: aiming for 150 minutes of moderate intensity exercise every week.  - Recommend to start Ozempic  0.25 mg weekly x4 weeks, then increase to 0.5 mg weekly. Patient  was educated on storage, administration, and AE. Will collaborate with patient advocate team to pursue coverage. Will contact patient with an update by the end of this week.   - Next A1C due Sept 2025     Hypertension: - Currently uncontrolled with clinic BP consistently above goal less than 130/80. Patient is not having s/sx of hypo- or hyper-tension. Medication adherence appears appropriate following PCP visit in June. Denies AE since starting hydrochlorothiazide .  - Reviewed long term cardiovascular and renal outcomes of uncontrolled blood pressure - Reviewed appropriate blood pressure monitoring technique and reviewed goal blood pressure. Recommended to check home blood pressure and heart rate as he is able at the pharmacy or with his sister's BP cuff.  - Recommend to continue amlodipine  10 mg daily, hydrochlorothiazide  12.5 mg daily    Hyperlipidemia/ASCVD Risk Reduction: - Currently controlled with most recent LDL-C of 91 mg/dL below goal < 899 mg/dL given U7IF and age < 40.    Patient verbalized understanding of treatment plan.   Follow Up Plan:   Pharmacist telephone 04/04/24 - will reach out sooner with update on Ozempic  coverage PCP clinic visit 03/22/24   Lorain Baseman, PharmD [facility] [phone]

## 2024-02-16 ENCOUNTER — Other Ambulatory Visit: Payer: Self-pay

## 2024-02-17 ENCOUNTER — Other Ambulatory Visit: Payer: Self-pay

## 2024-02-17 ENCOUNTER — Telehealth: Payer: Self-pay

## 2024-02-17 NOTE — Telephone Encounter (Signed)
 Pharmacy Patient Advocate Encounter   Received notification from CoverMyMeds that prior authorization for OZEMPIC  is required/requested.   Insurance verification completed.   The patient is insured through [employer] .   Per test claim: PA required; PA submitted to above mentioned insurance via CoverMyMeds Key/confirmation #/EOC [id number] Status is pending

## 2024-02-21 ENCOUNTER — Other Ambulatory Visit: Payer: Self-pay

## 2024-02-23 ENCOUNTER — Telehealth: Payer: Self-pay

## 2024-02-23 NOTE — Telephone Encounter (Signed)
 Pharmacy Patient Advocate Encounter  Received notification from [employer] that Prior Authorization for OZEMPIC  has been APPROVED from 02/22/2024 to 02/21/2025   PA #/Case ID/Reference #: [id number]

## 2024-03-21 ENCOUNTER — Ambulatory Visit: Payer: Self-pay | Admitting: Nurse Practitioner

## 2024-03-22 ENCOUNTER — Ambulatory Visit: Payer: Self-pay | Admitting: Nurse Practitioner

## 2024-03-27 ENCOUNTER — Ambulatory Visit (INDEPENDENT_AMBULATORY_CARE_PROVIDER_SITE_OTHER): Payer: Self-pay | Admitting: Nurse Practitioner

## 2024-03-27 ENCOUNTER — Encounter: Payer: Self-pay | Admitting: Nurse Practitioner

## 2024-03-27 DIAGNOSIS — I10 Essential (primary) hypertension: Secondary | ICD-10-CM | POA: Diagnosis not present

## 2024-03-27 LAB — POCT GLYCOSYLATED HEMOGLOBIN (HGB A1C): Hemoglobin A1C: 5.8 % — AB (ref 4.0–5.6)

## 2024-03-27 MED ORDER — HYDROCHLOROTHIAZIDE 12.5 MG PO TABS: 12.5000 mg | ORAL_TABLET | Freq: Every day | ORAL | 3 refills | Status: DC

## 2024-03-27 MED ORDER — AMLODIPINE BESYLATE 10 MG PO TABS: 10.0000 mg | ORAL_TABLET | Freq: Every day | ORAL | 2 refills | Status: AC

## 2024-03-27 NOTE — Progress Notes (Signed)
Subjective   Patient ID: Darius Joyce, male    DOB: 08-04-1985, 38 y.o.   MRN: [id number]  No chief complaint on file.   Referring provider: Oley Bascom RAMAN, NP  Darius Joyce is a 38 y.o. male with Past Medical History: 12/23/2020: Hilar adenopathy   HPI  Patient presents today for follow-up visit.  Overall he has been doing well.  Has lost some weight. A1c is elevated at 5.9. Denies f/c/s, n/v/d, hemoptysis, PND, leg swelling. Denies chest pain or edema.    No Known Allergies   There is no immunization history on file for this patient.  Tobacco History: Social History   Tobacco Use  Smoking Status Never  Smokeless Tobacco Never   Counseling given: Not Answered   Outpatient Encounter Medications as of 03/27/2024  Medication Sig   [DISCONTINUED] amLODipine  (NORVASC ) 10 MG tablet Take 1 tablet (10 mg total) by mouth daily.   [DISCONTINUED] hydrochlorothiazide  (HYDRODIURIL ) 12.5 MG tablet Take 1 tablet (12.5 mg total) by mouth daily.   albuterol  (VENTOLIN  HFA) 108 (90 Base) MCG/ACT inhaler Inhale 2 puffs into the lungs every 6 (six) hours as needed. (Patient not taking: Reported on 03/27/2024)   amLODipine  (NORVASC ) 10 MG tablet Take 1 tablet (10 mg total) by mouth daily.   fluticasone  furoate-vilanterol (BREO ELLIPTA ) 100-25 MCG/INH AEPB Inhale 1 puff into the lungs daily. (Patient not taking: Reported on 03/27/2024)   hydrochlorothiazide  (HYDRODIURIL ) 12.5 MG tablet Take 1 tablet (12.5 mg total) by mouth daily.   Semaglutide ,0.25 or 0.5MG /DOS, 2 MG/3ML SOPN Inject 0.25 mg into the skin once a week. (Patient not taking: Reported on 03/27/2024)   No facility-administered encounter medications on file as of 03/27/2024.    Review of Systems  Review of Systems  Constitutional: Negative.   HENT: Negative.    Cardiovascular: Negative.   Gastrointestinal: Negative.   Allergic/Immunologic: Negative.   Neurological: Negative.   Psychiatric/Behavioral: Negative.        Objective:   BP (!) 148/85   Pulse 86   Temp 98.2 F (36.8 C)   Ht 5' 10 (1.778 m)   Wt (!) 380 lb (172.4 kg)   SpO2 98%   BMI 54.52 kg/m   Wt Readings from Last 5 Encounters:  03/27/24 (!) 380 lb (172.4 kg)  12/20/23 (!) 391 lb (177.4 kg)  09/24/23 (!) 415 lb (188.2 kg)  09/15/23 (!) 415 lb 3.2 oz (188.3 kg)  06/16/23 (!) 411 lb (186.4 kg)     Physical Exam Vitals and nursing note reviewed.  Constitutional:      General: He is not in acute distress.    Appearance: He is well-developed.  Cardiovascular:     Rate and Rhythm: Normal rate and regular rhythm.  Pulmonary:     Effort: Pulmonary effort is normal.     Breath sounds: Normal breath sounds.  Skin:    General: Skin is warm and dry.  Neurological:     Mental Status: He is alert and oriented to person, place, and time.       Assessment & Plan:   Hypertension, unspecified type -     amLODIPine  Besylate; Take 1 tablet (10 mg total) by mouth daily.  Dispense: 90 tablet; Refill: 2 -     hydroCHLOROthiazide ; Take 1 tablet (12.5 mg total) by mouth daily.  Dispense: 90 tablet; Refill: 3 -     POCT glycosylated hemoglobin (Hb A1C)     Return in about 6 months (around 09/24/2024).   Bascom RAMAN Oley, NP  03/27/2024  

## 2024-04-04 ENCOUNTER — Other Ambulatory Visit (HOSPITAL_COMMUNITY): Payer: Self-pay

## 2024-04-04 ENCOUNTER — Other Ambulatory Visit (INDEPENDENT_AMBULATORY_CARE_PROVIDER_SITE_OTHER): Payer: Self-pay

## 2024-04-04 DIAGNOSIS — I10 Essential (primary) hypertension: Secondary | ICD-10-CM

## 2024-04-04 MED ORDER — HYDROCHLOROTHIAZIDE 25 MG PO TABS: 25.0000 mg | ORAL_TABLET | Freq: Every day | ORAL | 3 refills | Status: AC

## 2024-04-04 NOTE — Progress Notes (Signed)
 04/04/2024 Name: Darius Joyce MRN: [id number] DOB: [birth year]  Chief Complaint  Patient presents with   Diabetes   Hypertension   Obesity    Darius Joyce is a 38 y.o. year old male who presented for a telephone visit.   They were referred to the pharmacist by their PCP for assistance in managing diabetes, hypertension, and medication access. PMH includes HTN, CHF, pulmonary sarcoidosis, marijuana use.    Subjective: Patient was last seen by PCP, Bascom Borer, NP on 12/20/23. At last visit, patient reported doing well. A1C improved from 7.0% to 5.9% on Ozempic . His BP was elevated to 145/91 mmHg, but he reported being out of his blood pressure medication. He was instructed to restart amlodipine  and add hydrochlorothiazide . He was engaged by pharmacy via telephone on 02/15/24. We submitted PA for Ozempic , which was approved. He did not have a way to monitor his BP, but was encouraged to use his sister's cuff. He was seen by PCP on 03/27/24. BP was still elevated to 148/85 mmHg, and patient reported that he had taken his medications that day. He had not picked up Ozempic  from the pharmacy - assumed it was not covered because cost was high.   Today, patient reports that he is doing ok - he is running low on his BP medications and needs refills. Ran test claim for Ozempic  today (appears cost for 1 mo is $175 - with $100 coinsurance, and $75 deductible). I enrolled him in a coupon card, which brought the cost down to $75. Patient still reports that this is too expensive for him. Unsure whether cost would decrease to $25/mo if he pays the $75 to satisfy his deductible.    Care Team: Primary Care Provider: Borer Bascom RAMAN, NP ; Next Scheduled Visit: 09/25/24   Medication Access/Adherence  Current Pharmacy:  [id number] [address], [place] - [address] [place] [address] Phone: [phone] Fax: [phone]  [facility] [address] [place] [address] Phone: [phone] Fax: [phone]   Patient reports affordability concerns with their medications: Yes  - Ozempic  too expensive. - appears cost for 1 mo is $175 - with $100 coinsurance, and $75 deductible). I enrolled him in a coupon card, which brought the cost down to $75. Patient still reports that this is too expensive for him. Unsure whether cost would decrease to $25/mo if he pays the $75 to satisfy his deductible.   Patient reports access/transportation concerns to their pharmacy: No   Patient reports adherence concerns with their medications:  Yes  - went a period without his BP medications, now taking daily   Diabetes:  Current medications: Ozempic  0.25 mg weekly (not taking, will remove from medication list today due to cost)  Previous medications: N/A  Current glucose readings: not checking  Patient denies hypoglycemic s/sx including dizziness, shakiness, sweating. Patient denies hyperglycemic symptoms including polyuria, polydipsia, polyphagia, nocturia, neuropathy, blurred vision.  Current meal patterns: Reports his appetite is normal - eating 2-3 meals/day.  - Drinks: water, occasional juice or soda  Hypertension:  Current medications: amlodipine  10 mg daily, hydrochlorothiazide  12.5 mg daily  Patient does not have a validated, automated, upper arm home BP cuff -  Current blood pressure readings readings: N/A  Patient denies hypotensive s/sx including dizziness, lightheadedness.  Patient denies hypertensive symptoms including headache, chest pain, shortness of breath  Current meal patterns:  - Reports that he has tried to cut back on salt intake.  -  Denies caffeine intake - occasional soda - Denies use of nicotine products - Reports drinking plenty of water throughout the day.   Current physical activity: Walking at work .  Hyperlipidemia/ASCVD Risk Reduction  Current lipid lowering medications:  none Medications tried in the past: N/A   Clinical ASCVD: No  The ASCVD Risk score (Arnett DK, et al., 2019) failed to calculate for the following reasons:   The 2019 ASCVD risk score is only valid for ages 33 to 46    Objective:  BP Readings from Last 3 Encounters:  03/27/24 (!) 148/85  12/20/23 (!) 145/91  09/24/23 137/71    Lab Results  Component Value Date   HGBA1C 5.8 (A) 03/27/2024   HGBA1C 5.9 (A) 12/20/2023   HGBA1C 7.0 (A) 09/15/2023       Latest Ref Rng & Units 12/20/2023   11:06 AM 06/16/2023    3:27 PM 09/25/2022   10:44 AM  BMP  Glucose 70 - 99 mg/dL 893  99  98   BUN 6 - 20 mg/dL 10  7  10    Creatinine 0.76 - 1.27 mg/dL 9.21  9.06  9.11   BUN/Creat Ratio 9 - 20 13  8  11    Sodium 134 - 144 mmol/L 141  144  138   Potassium 3.5 - 5.2 mmol/L 4.3  4.3  4.0   Chloride 96 - 106 mmol/L 105  107  100   CO2 20 - 29 mmol/L 22  23  25    Calcium 8.7 - 10.2 mg/dL 8.6  9.0  8.9     Lab Results  Component Value Date   CHOL 149 12/20/2023   HDL 38 (L) 12/20/2023   LDLCALC 91 12/20/2023   TRIG 110 12/20/2023   CHOLHDL 3.9 12/20/2023    Medications Reviewed Today     Reviewed by Brinda Lorain SQUIBB, RPH (Pharmacist) on 04/04/24 at 1143  Med List Status: <None>   Medication Order Taking? Sig Documenting Provider Last Dose Status Informant  albuterol  (VENTOLIN  HFA) 108 (90 Base) MCG/ACT inhaler [id number]  Inhale 2 puffs into the lungs every 6 (six) hours as needed.  Patient not taking: Reported on 03/27/2024   Meade Verdon RAMAN, MD  Active   amLODipine  (NORVASC ) 10 MG tablet [id number] Yes Take 1 tablet (10 mg total) by mouth daily. Oley Bascom RAMAN, NP  Active   fluticasone  furoate-vilanterol (BREO ELLIPTA ) 100-25 MCG/INH AEPB [id number]  Inhale 1 puff into the lungs daily.  Patient not taking: Reported on 03/27/2024   Meade Verdon RAMAN, MD  Active   hydrochlorothiazide  (HYDRODIURIL ) 25 MG tablet [id number]  Take 1 tablet (25 mg total) by mouth daily. Oley Bascom RAMAN, NP   Active   Semaglutide ,0.25 or 0.5MG /DOS, 2 MG/3ML SOPN [id number]  Inject 0.25 mg into the skin once a week.  Patient not taking: Reported on 04/04/2024   Oley Bascom RAMAN, NP  Active               Assessment/Plan:   Diabetes: - Currently controlled with most recent A1C of 5.9% below goal <7% with diet and lifestyle alone. Patient is a good candidate for a GLP-1RA or GLP-1/GIP RA to prevent progression of diabetes and manage metabolic syndrome, with BMI > 50, however cost to start Ozempic  ($75 - at least for the first month) is not feasible for patient at this time. Discussed with him that we can revisit in the new year to see whether there have been any changes with his insurance. Commended  him for maintaining glycemic control.  - Last UACR March 2025 - 17 mg/g - Patient denies personal or family history of multiple endocrine neoplasia type 2, medullary thyroid cancer; personal history of pancreatitis or gallbladder disease. - Reviewed long term cardiovascular and renal outcomes of uncontrolled blood sugar - Reviewed goal A1c, goal fasting, and goal 2 hour post prandial glucose - Reviewed dietary modifications including  utilizing the healthy plate method, limiting portion size of carbohydrate foods, increasing intake of protein and non-starchy vegetables. Counseled patient to stay hydrated with water throughout the day. - Reviewed lifestyle modifications including: aiming for 150 minutes of moderate intensity exercise every week.  - Revisit cost of Ozempic  at follow-up, if he would like to consider paying $75 to see if that satisfies his deductible and lowers cost to $25/mo - Next A1C due Sept 2025     Hypertension: - Currently uncontrolled with clinic BP consistently above goal less than 130/80 despite restarting his BP medications. Patient is not having s/sx of hypo- or hyper-tension. Will increase hydrochlorothiazide  to 25 mg daily. Will repeat BMP to monitor electrolytes in ~ 1 mo.  -  Reviewed long term cardiovascular and renal outcomes of uncontrolled blood pressure - Reviewed appropriate blood pressure monitoring technique and reviewed goal blood pressure. Recommended to check home blood pressure and heart rate as he is able at the pharmacy or with his sister's BP cuff.  - Recommend to continue amlodipine  10 mg daily, - Recommend to increase hydrochlorothiazide  to 25 mg daily   Hyperlipidemia/ASCVD Risk Reduction: - Currently controlled with most recent LDL-C of 91 mg/dL below goal < 899 mg/dL given U7IF and age < 40.    Patient verbalized understanding of treatment plan.   Follow Up Plan:  Pharmacist tin person 05/16/24 PCP clinic visit 09/25/24   Lorain Baseman, PharmD [facility] [phone]

## 2024-04-04 NOTE — Progress Notes (Signed)
 Ozempic  savings card for future use:

## 2024-05-04 ENCOUNTER — Encounter (HOSPITAL_COMMUNITY): Payer: Self-pay

## 2024-05-04 ENCOUNTER — Ambulatory Visit (HOSPITAL_COMMUNITY)
Admission: RE | Admit: 2024-05-04 | Discharge: 2024-05-04 | Disposition: A | Discharge status: Home / Self Care | Payer: Self-pay | Source: Ambulatory Visit | Attending: Emergency Medicine | Admitting: Emergency Medicine

## 2024-05-04 ENCOUNTER — Other Ambulatory Visit: Payer: Self-pay

## 2024-05-04 VITALS — BP 135/90 | HR 72 | Temp 98.9°F | Resp 20

## 2024-05-04 DIAGNOSIS — K047 Periapical abscess without sinus: Secondary | ICD-10-CM | POA: Diagnosis not present

## 2024-05-04 HISTORY — DX: Essential (primary) hypertension: I10

## 2024-05-04 MED ORDER — AMOXICILLIN-POT CLAVULANATE 875-125 MG PO TABS: 1.0000 | ORAL_TABLET | Freq: Two times a day (BID) | ORAL | 0 refills | Status: AC

## 2024-05-04 NOTE — Discharge Instructions (Signed)
 Start taking Augmentin  twice daily for 10 days for dental abscess. You can alternate between 650 mg of Tylenol  and 400 to 600 mg of ibuprofen every 6-8 hours as needed for pain. I have attached a list of dental resources that you can follow-up with for further evaluation of this. Follow-up with your primary care provider or return here as needed.

## 2024-05-04 NOTE — ED Provider Notes (Signed)
 [facility]    CSN: [id number] Arrival date & time: 05/04/24  9187      History   Chief Complaint Chief Complaint  Patient presents with   Dental Problem    Entered by patient   Dental Pain    HPI Darius Joyce is a 38 y.o. male.   Patient presents with concerns for dental abscess to left lower mouth. Patient states that he noticed some pain and swelling to his left lower mouth that began about 2 days ago and has progressively worsened since.  Denies fever, body aches, chills, and weakness.  Patient states that he has previously had a dental abscess years ago.  Patient states that he does not currently have a dentist and is requesting resources for this.  The history is provided by the patient and medical records.  Dental Pain   Past Medical History:  Diagnosis Date   Hilar adenopathy 12/23/2020   Hypertension     Patient Active Problem List   Diagnosis Date Noted   Hypertension 10/29/2021   Congestive heart failure, unspecified HF chronicity, unspecified heart failure type (HCC) 07/14/2021   Pulmonary sarcoidosis 07/14/2021   Essential hypertension    Elective surgery    Vitamin D deficiency    Lung mass 12/23/2020   Pleuritic chest pain 12/23/2020   Marijuana smoker, continuous 12/23/2020   Mediastinal adenopathy 12/23/2020   Hilar adenopathy 12/23/2020   Pulmonary consolidation determined by examination 12/23/2020   Dyspnea 12/22/2020    Past Surgical History:  Procedure Laterality Date   BRONCHIAL BIOPSY  12/24/2020   Procedure: BRONCHIAL BIOPSIES;  Surgeon: Meade Verdon RAMAN, MD;  Location: [facility] ENDOSCOPY;  Service: Pulmonary;;   BRONCHIAL NEEDLE ASPIRATION BIOPSY  12/24/2020   Procedure: BRONCHIAL NEEDLE ASPIRATION BIOPSIES;  Surgeon: Meade Verdon RAMAN, MD;  Location: [facility] ENDOSCOPY;  Service: Pulmonary;;   BRONCHIAL WASHINGS  12/24/2020   Procedure: BRONCHIAL WASHINGS;  Surgeon: Meade Verdon RAMAN, MD;  Location: [facility] ENDOSCOPY;  Service: Pulmonary;;    ENDOBRONCHIAL ULTRASOUND N/A 12/24/2020   Procedure: ENDOBRONCHIAL ULTRASOUND;  Surgeon: Meade Verdon RAMAN, MD;  Location: [facility] ENDOSCOPY;  Service: Pulmonary;  Laterality: N/A;   HEMOSTASIS CONTROL  12/24/2020   Procedure: HEMOSTASIS CONTROL;  Surgeon: Meade Verdon RAMAN, MD;  Location: [facility] ENDOSCOPY;  Service: Pulmonary;;   VIDEO BRONCHOSCOPY N/A 12/24/2020   Procedure: VIDEO BRONCHOSCOPY WITH FLUORO;  Surgeon: Meade Verdon RAMAN, MD;  Location: [facility] ENDOSCOPY;  Service: Pulmonary;  Laterality: N/A;       Home Medications    Prior to Admission medications   Medication Sig Start Date End Date Taking? Authorizing Provider  amLODipine  (NORVASC ) 10 MG tablet Take 1 tablet (10 mg total) by mouth daily. 03/27/24  Yes Oley Bascom RAMAN, NP  amoxicillin -clavulanate (AUGMENTIN ) 875-125 MG tablet Take 1 tablet by mouth every 12 (twelve) hours for 10 days. 05/04/24 05/14/24 Yes Necha Harries A, NP  hydrochlorothiazide  (HYDRODIURIL ) 25 MG tablet Take 1 tablet (25 mg total) by mouth daily. 04/04/24  Yes Oley Bascom RAMAN, NP  albuterol  (VENTOLIN  HFA) 108 (90 Base) MCG/ACT inhaler Inhale 2 puffs into the lungs every 6 (six) hours as needed. Patient not taking: Reported on 03/27/2024 01/13/21   Meade Verdon RAMAN, MD  fluticasone  furoate-vilanterol (BREO ELLIPTA ) 100-25 MCG/INH AEPB Inhale 1 puff into the lungs daily. Patient not taking: Reported on 03/27/2024 01/13/21   Meade Verdon RAMAN, MD    Family History Family History  Problem Relation Age of Onset   Hypertension Mother    Hypertension Father  Pulmonary disease Brother    Asthma Brother     Social History Social History   Tobacco Use   Smoking status: Never   Smokeless tobacco: Never  Vaping Use   Vaping status: Never Used  Substance Use Topics   Alcohol use: Yes    Alcohol/week: 1.0 standard drink of alcohol    Types: 1 Glasses of wine per week    Comment: rarely   Drug use: Not Currently    Types: Marijuana    Comment: smokes daily      Allergies   Patient has no known allergies.   Review of Systems Review of Systems  Per HPI  Physical Exam Triage Vital Signs ED Triage Vitals  Encounter Vitals Group     BP 05/04/24 0827 (!) 135/90     Girls Systolic BP Percentile --      Girls Diastolic BP Percentile --      Boys Systolic BP Percentile --      Boys Diastolic BP Percentile --      Pulse Rate 05/04/24 0827 72     Resp 05/04/24 0827 20     Temp 05/04/24 0827 98.9 F (37.2 C)     Temp src --      SpO2 05/04/24 0827 95 %     Weight --      Height --      Head Circumference --      Peak Flow --      Pain Score 05/04/24 0826 4     Pain Loc --      Pain Education --      Exclude from Growth Chart --    No data found.  Updated Vital Signs BP (!) 135/90   Pulse 72   Temp 98.9 F (37.2 C)   Resp 20   SpO2 95%   Visual Acuity Right Eye Distance:   Left Eye Distance:   Bilateral Distance:    Right Eye Near:   Left Eye Near:    Bilateral Near:     Physical Exam Vitals and nursing note reviewed.  Constitutional:      General: He is awake. He is not in acute distress.    Appearance: Normal appearance. He is well-developed and well-groomed. He is not ill-appearing.  HENT:     Mouth/Throat:     Dentition: Abnormal dentition. Dental tenderness, gingival swelling, dental caries and dental abscesses present.     Comments: Dental abscess noted to the left lower gumline with surrounding gingival swelling and tenderness.  A few missing teeth and dental caries noted to this area. Skin:    General: Skin is warm and dry.  Neurological:     Mental Status: He is alert.  Psychiatric:        Behavior: Behavior is cooperative.      UC Treatments / Results  Labs (all labs ordered are listed, but only abnormal results are displayed) Labs Reviewed - No data to display  EKG   Radiology No results found.  Procedures Procedures (including critical care time)  Medications Ordered in  UC Medications - No data to display  Initial Impression / Assessment and Plan / UC Course  I have reviewed the triage vital signs and the nursing notes.  Pertinent labs & imaging results that were available during my care of the patient were reviewed by me and considered in my medical decision making (see chart for details).     Patient is overall well-appearing.  Vitals are stable.  Prescribed Augmentin  for dental abscess coverage.  Recommended Tylenol  and ibuprofen as needed for pain.  Given dental resources.  Discussed follow-up and return precautions. Final Clinical Impressions(s) / UC Diagnoses   Final diagnoses:  Dental abscess     Discharge Instructions      Start taking Augmentin  twice daily for 10 days for dental abscess. You can alternate between 650 mg of Tylenol  and 400 to 600 mg of ibuprofen every 6-8 hours as needed for pain. I have attached a list of dental resources that you can follow-up with for further evaluation of this. Follow-up with your primary care provider or return here as needed.   ED Prescriptions     Medication Sig Dispense Auth. Provider   amoxicillin -clavulanate (AUGMENTIN ) 875-125 MG tablet Take 1 tablet by mouth every 12 (twelve) hours for 10 days. 20 tablet Johnie Flaming A, NP      PDMP not reviewed this encounter.   Johnie Flaming A, NP 05/04/24 [phone]

## 2024-05-04 NOTE — ED Triage Notes (Signed)
 PT reports lower anterior dental abscess for 2 days.

## 2024-05-16 ENCOUNTER — Ambulatory Visit: Payer: Self-pay

## 2024-05-16 ENCOUNTER — Other Ambulatory Visit (INDEPENDENT_AMBULATORY_CARE_PROVIDER_SITE_OTHER): Payer: Self-pay

## 2024-05-16 ENCOUNTER — Other Ambulatory Visit (HOSPITAL_COMMUNITY): Payer: Self-pay

## 2024-05-16 DIAGNOSIS — I10 Essential (primary) hypertension: Secondary | ICD-10-CM

## 2024-05-16 NOTE — Progress Notes (Deleted)
 05/16/2024 Name: Darius Joyce MRN: [id number] DOB: 12-02-1985  No chief complaint on file.   Darius Joyce is a 38 y.o. year old male who presented for a telephone visit.   They were referred to the pharmacist by their PCP for assistance in managing diabetes, hypertension, and medication access. PMH includes HTN, CHF, pulmonary sarcoidosis, marijuana use.    Subjective: Patient was last seen by PCP, Bascom Borer, NP on 12/20/23. At last visit, patient reported doing well. A1C improved from 7.0% to 5.9% on Ozempic . His BP was elevated to 145/91 mmHg, but he reported being out of his blood pressure medication. He was instructed to restart amlodipine  and add hydrochlorothiazide . He was engaged by pharmacy via telephone on 02/15/24. We submitted PA for Ozempic , which was approved. He did not have a way to monitor his BP, but was encouraged to use his sister's cuff. He was seen by PCP on 03/27/24. BP was still elevated to 148/85 mmHg, and patient reported that he had taken his medications that day. He had not picked up Ozempic  from the pharmacy - assumed it was not covered because cost was high. At pharmacy call on 04/04/24, discovered that cost of Ozempic  was $175 (reduced to $75 with coupon) due to $75 deductible and $100 coinsurance. Patient reported this was not affordable. For elevated BP, he was instructed to increase hydrochlorothiazide  to 25 mg daily.   Today, ***  Today, patient reports that he is doing ok - he is running low on his BP medications and needs refills. Ran test claim for Ozempic  today (appears cost for 1 mo is $175 - with $100 coinsurance, and $75 deductible). I enrolled him in a coupon card, which brought the cost down to $75. Patient still reports that this is too expensive for him. Unsure whether cost would decrease to $25/mo if he pays the $75 to satisfy his deductible.    Care Team: Primary Care Provider: Borer Bascom RAMAN, NP ; Next Scheduled Visit:  09/25/24   Medication Access/Adherence  Current Pharmacy:  [id number] [address], [place] - [address] [place] [address] Phone: [phone] Fax: [phone]  [facility] [address] [place] [address] Phone: [phone] Fax: [phone]   Patient reports affordability concerns with their medications: Yes  - Ozempic  too expensive. - appears cost for 1 mo is $175 - with $100 coinsurance, and $75 deductible). I enrolled him in a coupon card, which brought the cost down to $75. Patient still reports that this is too expensive for him. Unsure whether cost would decrease to $25/mo if he pays the $75 to satisfy his deductible.   Patient reports access/transportation concerns to their pharmacy: No   Patient reports adherence concerns with their medications:  Yes  - went a period without his BP medications, now taking daily   Diabetes:  Current medications: Ozempic  0.25 mg weekly (not taking, will remove from medication list today due to cost)  Previous medications: N/A  Current glucose readings: not checking  Patient denies hypoglycemic s/sx including dizziness, shakiness, sweating. Patient denies hyperglycemic symptoms including polyuria, polydipsia, polyphagia, nocturia, neuropathy, blurred vision.  Current meal patterns: Reports his appetite is normal - eating 2-3 meals/day.  - Drinks: water, occasional juice or soda  Hypertension:  Current medications: amlodipine  10 mg daily, hydrochlorothiazide  12.5 mg daily  Patient does not have a validated, automated, upper arm home BP cuff -  Current blood pressure readings readings: N/A  Patient  denies hypotensive s/sx including dizziness, lightheadedness.  Patient denies hypertensive symptoms including headache, chest pain, shortness of breath  Current meal patterns:  - Reports that he has tried to cut back on salt intake.  - Denies caffeine intake  - occasional soda - Denies use of nicotine products - Reports drinking plenty of water throughout the day.   Current physical activity: Walking at work .  Hyperlipidemia/ASCVD Risk Reduction  Current lipid lowering medications: none Medications tried in the past: N/A   Clinical ASCVD: No  The ASCVD Risk score (Arnett DK, et al., 2019) failed to calculate for the following reasons:   The 2019 ASCVD risk score is only valid for ages 90 to 79    Objective:  BP Readings from Last 3 Encounters:  05/04/24 (!) 135/90  03/27/24 (!) 148/85  12/20/23 (!) 145/91    Lab Results  Component Value Date   HGBA1C 5.8 (A) 03/27/2024   HGBA1C 5.9 (A) 12/20/2023   HGBA1C 7.0 (A) 09/15/2023       Latest Ref Rng & Units 12/20/2023   11:06 AM 06/16/2023    3:27 PM 09/25/2022   10:44 AM  BMP  Glucose 70 - 99 mg/dL 893  99  98   BUN 6 - 20 mg/dL 10  7  10    Creatinine 0.76 - 1.27 mg/dL 9.21  9.06  9.11   BUN/Creat Ratio 9 - 20 13  8  11    Sodium 134 - 144 mmol/L 141  144  138   Potassium 3.5 - 5.2 mmol/L 4.3  4.3  4.0   Chloride 96 - 106 mmol/L 105  107  100   CO2 20 - 29 mmol/L 22  23  25    Calcium 8.7 - 10.2 mg/dL 8.6  9.0  8.9     Lab Results  Component Value Date   CHOL 149 12/20/2023   HDL 38 (L) 12/20/2023   LDLCALC 91 12/20/2023   TRIG 110 12/20/2023   CHOLHDL 3.9 12/20/2023    Medications Reviewed Today   Medications were not reviewed in this encounter       Assessment/Plan:   Diabetes: - Currently controlled with most recent A1C of 5.9% below goal <7% with diet and lifestyle alone. Patient is a good candidate for a GLP-1RA or GLP-1/GIP RA to prevent progression of diabetes and manage metabolic syndrome, with BMI > 50, however cost to start Ozempic  ($75 - at least for the first month) is not feasible for patient at this time. Discussed with him that we can revisit in the new year to see whether there have been any changes with his insurance. Commended him for  maintaining glycemic control.  - Last UACR March 2025 - 17 mg/g - Patient denies personal or family history of multiple endocrine neoplasia type 2, medullary thyroid cancer; personal history of pancreatitis or gallbladder disease. - Reviewed long term cardiovascular and renal outcomes of uncontrolled blood sugar - Reviewed goal A1c, goal fasting, and goal 2 hour post prandial glucose - Reviewed dietary modifications including  utilizing the healthy plate method, limiting portion size of carbohydrate foods, increasing intake of protein and non-starchy vegetables. Counseled patient to stay hydrated with water throughout the day. - Reviewed lifestyle modifications including: aiming for 150 minutes of moderate intensity exercise every week.  - Revisit cost of Ozempic  at follow-up, if he would like to consider paying $75 to see if that satisfies his deductible and lowers cost to $25/mo - Next A1C due Sept 2025  Hypertension: - Currently uncontrolled with clinic BP consistently above goal less than 130/80 despite restarting his BP medications. Patient is not having s/sx of hypo- or hyper-tension. Will increase hydrochlorothiazide  to 25 mg daily. Will repeat BMP to monitor electrolytes in ~ 1 mo.  - Reviewed long term cardiovascular and renal outcomes of uncontrolled blood pressure - Reviewed appropriate blood pressure monitoring technique and reviewed goal blood pressure. Recommended to check home blood pressure and heart rate as he is able at the pharmacy or with his sister's BP cuff.  - Recommend to continue amlodipine  10 mg daily, - Recommend to increase hydrochlorothiazide  to 25 mg daily   Hyperlipidemia/ASCVD Risk Reduction: - Currently controlled with most recent LDL-C of 91 mg/dL below goal < 899 mg/dL given U7IF and age < 40.    Patient verbalized understanding of treatment plan.   Follow Up Plan:  Pharmacist tin person 05/16/24 PCP clinic visit 09/25/24   Lorain Baseman, PharmD [facility] [phone]

## 2024-05-16 NOTE — Progress Notes (Signed)
 05/16/2024 Name: Darius Joyce MRN: [id number] DOB: 09-15-1985  Chief Complaint  Patient presents with   Hypertension    Darius Joyce is a 38 y.o. year old male who presented for a telephone visit. Originally scheduled for an in person appt, but patient requested to switch to telephone.    They were referred to the pharmacist by their PCP for assistance in managing diabetes, hypertension, and medication access. PMH includes HTN, CHF, pulmonary sarcoidosis, marijuana use.    Subjective: Patient was last seen by PCP, Bascom Borer, NP on 12/20/23. At last visit, patient reported doing well. A1C improved from 7.0% to 5.9% on Ozempic . His BP was elevated to 145/91 mmHg, but he reported being out of his blood pressure medication. He was instructed to restart amlodipine  and add hydrochlorothiazide . He was engaged by pharmacy via telephone on 02/15/24. We submitted PA for Ozempic , which was approved. He did not have a way to monitor his BP, but was encouraged to use his sister's cuff. He was seen by PCP on 03/27/24. BP was still elevated to 148/85 mmHg, and patient reported that he had taken his medications that day. He had not picked up Ozempic  from the pharmacy - assumed it was not covered because cost was high. At pharmacy call on 04/04/24, discovered that cost of Ozempic  was $175 (reduced to $75 with coupon) due to $75 deductible and $100 coinsurance. Patient reported this was not affordable. For elevated BP, he was instructed to increase hydrochlorothiazide  to 25 mg daily.   Today, patient reports doing ok. He did not increase dose of hydrochlorothiazide . Says he is still taking 12.5 mg daily, but there is a discrepancy in fill hx. Reports he was not able to afford to pick it up. Ran test claim and confirmed that cost to fill 90ds of hydrochlorothiazide  and amlodipine  would be < $10. Patient stated that this would be affordable for him and he could pick up this week.   Care Team: Primary Care  Provider: Borer Bascom RAMAN, NP ; Next Scheduled Visit: 09/25/24   Medication Access/Adherence  Current Pharmacy:  [id number] [address], [place] - [address] [place] [address] Phone: [phone] Fax: [phone]   Patient reports affordability concerns with their medications: Yes  - Ozempic  too expensive. - appears cost for 1 mo is $175 - with $100 coinsurance, and $75 deductible). I enrolled him in a coupon card, which brought the cost down to $75. Patient still reports that this is too expensive for him. Unsure whether cost would decrease to $25/mo if he pays the $75 to satisfy his deductible.   Patient reports access/transportation concerns to their pharmacy: No   Patient reports adherence concerns with their medications:  Yes  - went a period without his BP medications, now taking daily   Diabetes:  Current medications: none Previous: Ozempic  (cost)  Current glucose readings: not checking  Patient denies hypoglycemic s/sx including dizziness, shakiness, sweating. Patient denies hyperglycemic symptoms including polyuria, polydipsia, polyphagia, nocturia, neuropathy, blurred vision.  Current meal patterns: Reports his appetite is normal - eating 2-3 meals/day.  - Drinks: water, occasional juice or soda  Hypertension:  Current medications: amlodipine  10 mg daily, hydrochlorothiazide  25 mg daily (still taking 12.5 mg daily)  Patient does not have a validated, automated, upper arm home BP cuff -  Current blood pressure readings readings: N/A  Patient denies hypotensive s/sx including dizziness, lightheadedness.  Patient denies hypertensive symptoms including chest pain, shortness of breath. Did have  HA yesterday in the setting of decreased food intake.  Current meal patterns:  - Reports that he has tried to cut back on salt intake.  - Denies caffeine intake - occasional soda - Denies use of nicotine products - Reports drinking  plenty of water throughout the day.   Current physical activity: Walking at work .  Hyperlipidemia/ASCVD Risk Reduction  Current lipid lowering medications: none Medications tried in the past: N/A   Clinical ASCVD: No  The ASCVD Risk score (Arnett DK, et al., 2019) failed to calculate for the following reasons:   The 2019 ASCVD risk score is only valid for ages 54 to 62    Objective:  BP Readings from Last 3 Encounters:  05/04/24 (!) 135/90  03/27/24 (!) 148/85  12/20/23 (!) 145/91    Lab Results  Component Value Date   HGBA1C 5.8 (A) 03/27/2024   HGBA1C 5.9 (A) 12/20/2023   HGBA1C 7.0 (A) 09/15/2023       Latest Ref Rng & Units 12/20/2023   11:06 AM 06/16/2023    3:27 PM 09/25/2022   10:44 AM  BMP  Glucose 70 - 99 mg/dL 893  99  98   BUN 6 - 20 mg/dL 10  7  10    Creatinine 0.76 - 1.27 mg/dL 9.21  9.06  9.11   BUN/Creat Ratio 9 - 20 13  8  11    Sodium 134 - 144 mmol/L 141  144  138   Potassium 3.5 - 5.2 mmol/L 4.3  4.3  4.0   Chloride 96 - 106 mmol/L 105  107  100   CO2 20 - 29 mmol/L 22  23  25    Calcium 8.7 - 10.2 mg/dL 8.6  9.0  8.9     Lab Results  Component Value Date   CHOL 149 12/20/2023   HDL 38 (L) 12/20/2023   LDLCALC 91 12/20/2023   TRIG 110 12/20/2023   CHOLHDL 3.9 12/20/2023    Medications Reviewed Today     Reviewed by Brinda Lorain SQUIBB, RPH (Pharmacist) on 05/16/24 at 1042  Med List Status: <None>   Medication Order Taking? Sig Documenting Provider Last Dose Status Informant  albuterol  (VENTOLIN  HFA) 108 (90 Base) MCG/ACT inhaler [id number]  Inhale 2 puffs into the lungs every 6 (six) hours as needed.  Patient not taking: Reported on 03/27/2024   Meade Verdon RAMAN, MD  Active   amLODipine  (NORVASC ) 10 MG tablet [id number] Yes Take 1 tablet (10 mg total) by mouth daily. Oley Bascom RAMAN, NP  Active   fluticasone  furoate-vilanterol (BREO ELLIPTA ) 100-25 MCG/INH AEPB [id number]  Inhale 1 puff into the lungs daily.  Patient not taking: Reported on  03/27/2024   Desai, Nikita S, MD  Active   hydrochlorothiazide  (HYDRODIURIL ) 25 MG tablet [id number]  Take 1 tablet (25 mg total) by mouth daily.  Patient not taking: Reported on 05/16/2024   Oley Bascom RAMAN, NP  Active               Assessment/Plan:   Diabetes: - Currently controlled with most recent A1C of 5.9% below goal <7% with diet and lifestyle alone. Patient is a good candidate for a GLP-1RA or GLP-1/GIP RA to prevent progression of diabetes and manage metabolic syndrome, with BMI > 50, however cost to start Ozempic  ($75 - at least for the first month) is not feasible for patient at this time. Will revisit in the new year to see whether there have been any changes with his insurance. Commended him  for maintaining glycemic control.  - Last UACR March 2025 - 17 mg/g - Patient denies personal or family history of multiple endocrine neoplasia type 2, medullary thyroid cancer; personal history of pancreatitis or gallbladder disease. - Reviewed long term cardiovascular and renal outcomes of uncontrolled blood sugar - Reviewed goal A1c, goal fasting, and goal 2 hour post prandial glucose - Reviewed dietary modifications including  utilizing the healthy plate method, limiting portion size of carbohydrate foods, increasing intake of protein and non-starchy vegetables. Counseled patient to stay hydrated with water throughout the day. - Reviewed lifestyle modifications including: aiming for 150 minutes of moderate intensity exercise every week.  - Next A1C due Dec 2025     Hypertension: - Currently uncontrolled with clinic BP consistently above goal less than 130/80 despite restarting his BP medications. Patient is not having s/sx of hypo- or hyper-tension. Will increase hydrochlorothiazide  to 25 mg daily as previously instructed. Will repeat BMP to monitor electrolytes in ~ 1 mo.  - Reviewed long term cardiovascular and renal outcomes of uncontrolled blood pressure - Reviewed appropriate  blood pressure monitoring technique and reviewed goal blood pressure. Recommended to check home blood pressure and heart rate as he is able at the pharmacy. - Recommend to continue amlodipine  10 mg daily, - Recommend to increase hydrochlorothiazide  to 25 mg daily as previously instructed - Called pharmacy to request fill and confirm cost < $10 for 90 ds of both medications.   Hyperlipidemia/ASCVD Risk Reduction: - Currently controlled with most recent LDL-C of 91 mg/dL below goal < 899 mg/dL given U7IF and age < 40.    Patient verbalized understanding of treatment plan.   Follow Up Plan:  Pharmacist tin person 07/05/24 PCP clinic visit 09/25/24   Lorain Baseman, PharmD [facility] [phone]

## 2024-07-05 ENCOUNTER — Ambulatory Visit (INDEPENDENT_AMBULATORY_CARE_PROVIDER_SITE_OTHER)

## 2024-07-05 VITALS — BP 168/85 | HR 83 | Wt 382.6 lb

## 2024-07-05 DIAGNOSIS — I10 Essential (primary) hypertension: Secondary | ICD-10-CM | POA: Diagnosis not present

## 2024-07-05 NOTE — Patient Instructions (Signed)
 It was nice to see you today!  Your goal blood pressure is less than 130/80 mmHg. Please record values to discuss at our next appt.  Medication Changes: START hydrochlorothiazide  25 mg daily  Continue amlodipine  10 mg daily  Total for both medications (90 days[facility] is $7.71  Continue all other medication the same.   Lifestyle Recommendations:  Aim for 150 minutes of moderate intensity exercise every week. This is any activity that elevates your heart rate and breathing rate, but still allows you to carry on a conversation. Exercising after meals can help prevent spikes in your blood sugar.  Diet Recommendations:   Try to eat 3 real meals using the healthy plate method and 1-2 snacks per day. Never go more than 4-5 hours while awake without eating. Eat breakfast within the first hour of getting up.     Carbohydrates include starch, sugar, and fiber. Sugar and starch raise blood glucose.  Starchy foods include bread, rice, pasta, potatoes, corn, cereal, grits, crackers, bagels, muffins, all baked foods Some fruits are higher in sugar, like grapes, watermelon, oranges, and most tropical fruits. Limit your serving size to 1/2 cup at a time.  Protein foods include meat, fish, poultry, eggs, dairy, and beans (although beans also provide carbohydrates).  Non-starchy vegetables do not impact your blood sugar very much. These include greens, broccoli, asparagus, carrots, cauliflower, cucumber, mushrooms, peppers, yellow squash, zucchini squash, tomato, to name a few!  Avoid all sugary beverages. Stay hydrated with water throughout the day. Sparkling/flavored water, unsweet tea, black coffee, and zero-sugar or diet drinks will not impact your blood sugar, but they should not be used as your only source of hydration!  You can find more information at https://diabetes.org/food-nutrition/eating-healthy   Lorain Baseman, PharmD [facility] [phone]

## 2024-07-05 NOTE — Progress Notes (Signed)
 "  07/05/2024 Name: Darius Joyce MRN: [id number] DOB: [birth year]  Chief Complaint  Patient presents with   Hypertension    Darius Joyce is a 38 y.o. year old male who presented for a face-to-face visit.   They were referred to the pharmacist by their PCP for assistance in managing diabetes, hypertension, and medication access. PMH includes HTN, CHF, pulmonary sarcoidosis, marijuana use.    Subjective: Patient was last seen by PCP, Bascom Borer, NP on 12/20/23. At last visit, patient reported doing well. A1C improved from 7.0% to 5.9% on Ozempic . His BP was elevated to 145/91 mmHg, but he reported being out of his blood pressure medication. He was instructed to restart amlodipine  and add hydrochlorothiazide . He was engaged by pharmacy via telephone on 02/15/24. We submitted PA for Ozempic , which was approved. He did not have a way to monitor his BP, but was encouraged to use his sister's cuff. He was seen by PCP on 03/27/24. BP was still elevated to 148/85 mmHg, and patient reported that he had taken his medications that day. He had not picked up Ozempic  from the pharmacy - assumed it was not covered because cost was high. At pharmacy call on 04/04/24, discovered that cost of Ozempic  was $175 (reduced to $75 with coupon) due to $75 deductible and $100 coinsurance. Patient reported this was not affordable. For elevated BP, he was instructed to increase hydrochlorothiazide  to 25 mg daily. At call on 05/16/24, patient reported he had not made the changes previously discussed. Confirmed cost with pharmacy and patient stated it would be affordable.   Today, patient presents in good spirits without assistance. [facility] (amlodipine  10 mg daily and hydrochlorothiazide  25 mg) and they did not have them ready. May have been put back. He states he has a few tablets of each remaining, but last fill in June for 90ds.   Care Team: Primary Care Provider: Borer Bascom RAMAN, NP ; Next Scheduled Visit: 09/25/24   Medication Access/Adherence  Current Pharmacy:  [id number] [address], [place] - [address] [place] [address] Phone: [phone] Fax: [phone]   Patient reports affordability concerns with their medications: Yes  - Ozempic  too expensive. - appears cost for 1 mo is $175 - with $100 coinsurance, and $75 deductible). I enrolled him in a coupon card, which brought the cost down to $75. Patient still reports that this is too expensive for him. Unsure whether cost would decrease to $25/mo if he pays the $75 to satisfy his deductible. Will revisit in Jan 2026.  Patient reports access/transportation concerns to their pharmacy: No   Patient reports adherence concerns with their medications:  Yes  - fill hx indicates nonadherence. States he has pills left of amlodipine  and hydrochlorothiazide .   Hypertension:  Current medications: amlodipine  10 mg daily (taking intermittently), hydrochlorothiazide  25 mg daily (still taking 12.5 mg daily, taking intermittently)  Patient does not have a validated, automated, upper arm home BP cuff - reports his sister just got one so he may be able to check more frequently Current blood pressure readings readings: N/A  Patient denies hypotensive s/sx including dizziness, lightheadedness.  Patient denies hypertensive symptoms including chest pain, shortness of breath. Did have HA a few days ago.  Current meal patterns:  - Reports that he has tried to cut back on salt intake.  - Denies caffeine intake - occasional soda - Denies use of nicotine products - Reports drinking  plenty of water throughout the day.   Current physical activity: Walking at work.   Diabetes:  Current medications: none Previous: Ozempic  (cost)  Current glucose readings: not checking  Patient denies hypoglycemic s/sx including dizziness, shakiness, sweating. Patient denies hyperglycemic symptoms  including polyuria, polydipsia, polyphagia, nocturia, neuropathy, blurred vision.  Current meal patterns: Reports his appetite is normal - eating 2-3 meals/day.  - Drinks: water, occasional juice or soda   Hyperlipidemia/ASCVD Risk Reduction  Current lipid lowering medications: none Medications tried in the past: N/A   Clinical ASCVD: No  The ASCVD Risk score (Arnett DK, et al., 2019) failed to calculate for the following reasons:   The 2019 ASCVD risk score is only valid for ages 54 to 71    Objective:  BP Readings from Last 3 Encounters:  07/05/24 (!) 168/85  05/04/24 (!) 135/90  03/27/24 (!) 148/85    Lab Results  Component Value Date   HGBA1C 5.8 (A) 03/27/2024   HGBA1C 5.9 (A) 12/20/2023   HGBA1C 7.0 (A) 09/15/2023       Latest Ref Rng & Units 12/20/2023   11:06 AM 06/16/2023    3:27 PM 09/25/2022   10:44 AM  BMP  Glucose 70 - 99 mg/dL 893  99  98   BUN 6 - 20 mg/dL 10  7  10    Creatinine 0.76 - 1.27 mg/dL 9.21  9.06  9.11   BUN/Creat Ratio 9 - 20 13  8  11    Sodium 134 - 144 mmol/L 141  144  138   Potassium 3.5 - 5.2 mmol/L 4.3  4.3  4.0   Chloride 96 - 106 mmol/L 105  107  100   CO2 20 - 29 mmol/L 22  23  25    Calcium 8.7 - 10.2 mg/dL 8.6  9.0  8.9     Lab Results  Component Value Date   CHOL 149 12/20/2023   HDL 38 (L) 12/20/2023   LDLCALC 91 12/20/2023   TRIG 110 12/20/2023   CHOLHDL 3.9 12/20/2023    Medications Reviewed Today     Reviewed by Brinda Lorain SQUIBB, RPH-CPP (Pharmacist) on 07/05/24 at 1129  Med List Status: <None>   Medication Order Taking? Sig Documenting Provider Last Dose Status Informant  albuterol  (VENTOLIN  HFA) 108 (90 Base) MCG/ACT inhaler [id number]  Inhale 2 puffs into the lungs every 6 (six) hours as needed.  Patient not taking: Reported on 03/27/2024   Meade Verdon RAMAN, MD  Active   amLODipine  (NORVASC ) 10 MG tablet [id number]  Take 1 tablet (10 mg total) by mouth daily.  Patient not taking: Reported on 07/05/2024   Oley Bascom RAMAN, NP  Active    Patient not taking:   Discontinued 07/05/24 1115 (Expired Prescription)   hydrochlorothiazide  (HYDRODIURIL ) 25 MG tablet [id number]  Take 1 tablet (25 mg total) by mouth daily.  Patient not taking: Reported on 07/05/2024   Oley Bascom RAMAN, NP  Active               Assessment/Plan:    Hypertension: - Currently uncontrolled with clinic BP consistently above goal less than 130/80. Suspect lack of adherence. Emphasized importance of daily adherence. [facility]. Will repeat BMP to monitor electrolytes in ~ 1 mo assuming he increases hydrochlorothiazide  to 25 mg daily as instructed. - Reviewed long term cardiovascular and renal outcomes of uncontrolled blood pressure - Reviewed appropriate blood pressure monitoring technique and reviewed goal blood pressure. Recommended to check home blood pressure  and heart rate as he is able at the pharmacy or with his sister's BP cuff. Reviewed BP goal. - Recommend to continue amlodipine  10 mg daily, - Recommend to increase hydrochlorothiazide  to 25 mg daily as previously instructed - Called pharmacy to request fill and confirm cost < $10 for 90 ds of both medications.  Diabetes: - Currently controlled with most recent A1C of 5.9% below goal <7% with diet and lifestyle alone. Patient is a good candidate for a GLP-1RA or GLP-1/GIP RA to prevent progression of diabetes and manage metabolic syndrome, with BMI > 50, however cost to start Ozempic  ($75 - at least for the first month) is not feasible for patient at this time. Will revisit in the new year to see whether there have been any changes with his insurance. Commended him for maintaining glycemic control.  - Last UACR March 2025 - 17 mg/g - Patient denies personal or family history of multiple endocrine neoplasia type 2, medullary thyroid cancer; personal history of pancreatitis or gallbladder disease. - Reviewed long term cardiovascular and renal outcomes of  uncontrolled blood sugar - Reviewed goal A1c, goal fasting, and goal 2 hour post prandial glucose - Reviewed dietary modifications including  utilizing the healthy plate method, limiting portion size of carbohydrate foods, increasing intake of protein and non-starchy vegetables. Counseled patient to stay hydrated with water throughout the day. - Reviewed lifestyle modifications including: aiming for 150 minutes of moderate intensity exercise every week.  - Will repeat A1C at follow-up   Hyperlipidemia/ASCVD Risk Reduction: - Currently controlled with most recent LDL-C of 91 mg/dL below goal < 899 mg/dL given U7IF and age < 40.    Patient verbalized understanding of treatment plan.   Follow Up Plan:  Pharmacist tin person 07/05/24 PCP clinic visit 09/25/24   Lorain Baseman, PharmD [facility] [phone]  "

## 2024-08-09 ENCOUNTER — Other Ambulatory Visit (HOSPITAL_COMMUNITY): Payer: Self-pay

## 2024-08-09 ENCOUNTER — Ambulatory Visit: Payer: Self-pay

## 2024-08-09 VITALS — BP 135/80 | HR 88

## 2024-08-09 DIAGNOSIS — E1165 Type 2 diabetes mellitus with hyperglycemia: Secondary | ICD-10-CM

## 2024-08-09 DIAGNOSIS — E119 Type 2 diabetes mellitus without complications: Secondary | ICD-10-CM

## 2024-08-09 DIAGNOSIS — I10 Essential (primary) hypertension: Secondary | ICD-10-CM

## 2024-08-09 LAB — POCT GLYCOSYLATED HEMOGLOBIN (HGB A1C): HbA1c, POC (controlled diabetic range): 5.8 % (ref 0.0–7.0)

## 2024-08-09 MED ORDER — OZEMPIC (0.25 OR 0.5 MG/DOSE) 2 MG/3ML ~~LOC~~ SOPN: PEN_INJECTOR | SUBCUTANEOUS | 1 refills | Status: AC

## 2024-08-09 NOTE — Patient Instructions (Signed)
 It was nice to see you today!  Medication Changes: START Ozempic  0.25 mg once weekly for 4 weeks, then increase to 0.5 mg weekly.   Counseling Points This medication reduces your appetite and may make you feel fuller longer.  Stop eating when your body tells you that you are full. This will likely happen sooner than you are used to. Store your medication in the fridge until you are ready to use it. Inject your medication in the fatty tissue of your lower abdominal area (2 inches away from belly button) or upper outer thigh. Rotate injection sites. Each pen will last you about 1 month (the first month it will last a few weeks longer). Use a different needle with each weekly injection. Common side effects include: nausea, diarrhea/constipation, and heartburn, and are more likely to occur if you overeat.  Continue amlodipine  10 mg daily  Continue hydrochlorothiazide  25 mg daily  Continue all other medication the same.   Lifestyle Recommendations:  Aim for 150 minutes of moderate intensity exercise every week. This is any activity that elevates your heart rate and breathing rate, but still allows you to carry on a conversation. Exercising after meals can help prevent spikes in your blood sugar.  Diet Recommendations:   Try to eat 3 real meals using the healthy plate method and 1-2 snacks per day. Never go more than 4-5 hours while awake without eating. Eat breakfast within the first hour of getting up.     Carbohydrates include starch, sugar, and fiber. Sugar and starch raise blood glucose.  Starchy foods include bread, rice, pasta, potatoes, corn, cereal, grits, crackers, bagels, muffins, all baked foods Some fruits are higher in sugar, like grapes, watermelon, oranges, and most tropical fruits. Limit your serving size to 1/2 cup at a time.  Protein foods include meat, fish, poultry, eggs, dairy, and beans (although beans also provide carbohydrates).  Non-starchy vegetables do not  impact your blood sugar very much. These include greens, broccoli, asparagus, carrots, cauliflower, cucumber, mushrooms, peppers, yellow squash, zucchini squash, tomato, to name a few!  Avoid all sugary beverages. Stay hydrated with water throughout the day. Sparkling/flavored water, unsweet tea, black coffee, and zero-sugar or diet drinks will not impact your blood sugar, but they should not be used as your only source of hydration!  You can find more information at https://diabetes.org/food-nutrition/eating-healthy   Lorain Baseman, PharmD [facility] [phone]

## 2024-08-09 NOTE — Progress Notes (Signed)
 "  08/09/2024 Name: Darius Joyce MRN: [id number] DOB: 1985/11/08  Chief Complaint  Patient presents with   Diabetes   Hypertension    Darius Joyce is a 39 y.o. year old male who presented for a face-to-face visit.   They were referred to the pharmacist by their PCP for assistance in managing diabetes, hypertension, and medication access. PMH includes HTN, CHF, pulmonary sarcoidosis, marijuana use.    Subjective: He was seen by PCP on 03/27/24. BP was still elevated to 148/85 mmHg, and patient reported that he had taken his medications that day. He had not picked up Ozempic  from the pharmacy due to high cost. Patient presented to in person pharmacy appt on 07/05/24. BP was 168/85 mmHg in the setting of inconsistent adherence to medications. Assisted in coordinating refills for amlodipine  and hydrochlorothiazide .   Today, patient presents in good spirits without assistance. Reports he has been taking amlodipine  and hydrochlorothiazide  consistently for the past month. Test claim for Ozempic  today shows copay of $25, which patient reports is affordable for him at this time, and he is interested in starting this medication.  Care Team: Primary Care Provider: Oley Bascom RAMAN, NP ; Next Scheduled Visit: 09/25/24   Medication Access/Adherence  Current [facility]acy:  [id number] [address], [place] - [address] [place] [address] Phone: [phone] Fax: [phone]   Patient reports affordability concerns with their medications: Yes  - hx of Ozempic  being unaffordable due to cost. Test claim today shows $25 copay.  Patient reports access/transportation concerns to their pharmacy: No   Patient reports adherence concerns with their medications:  Yes  - hx of inconsistent adherence to BP medications  Hypertension:  Current medications: amlodipine  10 mg daily, hydrochlorothiazide  25 mg daily   Patient does not have a validated, automated, upper  arm home BP cuff - but occasionally uses his sister's cuff.  Current blood pressure readings readings: Last week got a reading of 170/75 mmHg, but felt it was due to increased alcohol use during the snow days.  Patient denies hypotensive s/sx including dizziness, lightheadedness.  Patient denies hypertensive symptoms including chest pain, shortness of breath, headaches.  Current meal patterns:  - Reports that he has tried to cut back on salt intake.  - Denies caffeine intake - occasional soda - Denies use of nicotine products - Reports drinking plenty of water throughout the day.   Current physical activity: Walking at work.   Diabetes:  Current medications: none Previous: Ozempic  (cost)  Current glucose readings: not checking  Patient denies hypoglycemic s/sx including dizziness, shakiness, sweating. Patient denies hyperglycemic symptoms including polyuria, polydipsia, polyphagia, nocturia, neuropathy, blurred vision.  Current meal patterns: Reports his appetite is normal - eating 2-3 meals/day.  - Drinks: water, occasional juice or soda   Hyperlipidemia/ASCVD Risk Reduction  Current lipid lowering medications: none Medications tried in the past: N/A   Clinical ASCVD: No  The ASCVD Risk score (Arnett DK, et al., 2019) failed to calculate for the following reasons:   The 2019 ASCVD risk score is only valid for ages 77 to 26    Objective:  BP Readings from Last 3 Encounters:  08/09/24 135/80  07/05/24 (!) 168/85  05/04/24 (!) 135/90    Lab Results  Component Value Date   HGBA1C 5.8 08/09/2024   HGBA1C 5.8 (A) 03/27/2024   HGBA1C 5.9 (A) 12/20/2023       Latest Ref Rng & Units 12/20/2023   11:06 AM 06/16/2023    3:27  PM 09/25/2022   10:44 AM  BMP  Glucose 70 - 99 mg/dL 893  99  98   BUN 6 - 20 mg/dL 10  7  10    Creatinine 0.76 - 1.27 mg/dL 9.21  9.06  9.11   BUN/Creat Ratio 9 - 20 13  8  11    Sodium 134 - 144 mmol/L 141  144  138   Potassium 3.5 - 5.2  mmol/L 4.3  4.3  4.0   Chloride 96 - 106 mmol/L 105  107  100   CO2 20 - 29 mmol/L 22  23  25    Calcium 8.7 - 10.2 mg/dL 8.6  9.0  8.9     Lab Results  Component Value Date   CHOL 149 12/20/2023   HDL 38 (L) 12/20/2023   LDLCALC 91 12/20/2023   TRIG 110 12/20/2023   CHOLHDL 3.9 12/20/2023    Medications Reviewed Today     Reviewed by Brinda Lorain SQUIBB, RPH-CPP (Pharmacist) on 08/09/24 at 1611  Med List Status: <None>   Medication Order Taking? Sig Documenting Provider Last Dose Status Informant  albuterol  (VENTOLIN  HFA) 108 (90 Base) MCG/ACT inhaler [id number]  Inhale 2 puffs into the lungs every 6 (six) hours as needed.  Patient not taking: Reported on 03/27/2024   Meade Verdon RAMAN, MD  Active   amLODipine  (NORVASC ) 10 MG tablet [id number] Yes Take 1 tablet (10 mg total) by mouth daily. Oley Bascom RAMAN, NP  Active   hydrochlorothiazide  (HYDRODIURIL ) 25 MG tablet [id number] Yes Take 1 tablet (25 mg total) by mouth daily. Oley Bascom RAMAN, NP  Active   Semaglutide ,0.25 or 0.5MG /DOS, (OZEMPIC , 0.25 OR 0.5 MG/DOSE,) 2 MG/3ML SOPN [id number] Yes Inject 0.25 mg into the skin once weekly for 4 weeks, then increase to 0.5 mg weekly. Oley Bascom RAMAN, NP  Active               Assessment/Plan:    Hypertension: - Currently uncontrolled with clinic BP today of 135/80 mmHg slightly above goal less than 130/80, but much improved. Utilized thigh cuff to measure BP for improved fit. Will recheck BMP to monitor kidney function and electrolytes on higher dose of hydrochlorothiazide . Patient may benefit from addition of ARB, but we are also starting Ozempic  today, and would like to see if patient can achieve improved BP control with weight loss, since he is very close to goal.  - Reviewed long term cardiovascular and renal outcomes of uncontrolled blood pressure - Reviewed appropriate blood pressure monitoring technique and reviewed goal blood pressure. Recommended to check home blood pressure and heart  rate as he is able at the pharmacy or with his sister's BP cuff. Reviewed BP goal. - Recommend to continue amlodipine  10 mg daily, hydrochlorothiazide  25 mg daily   Diabetes: - Currently controlled with A1C today of 5.8% below goal <7% with diet and lifestyle alone. Patient is a good candidate for a GLP-1RA or GLP-1/GIP RA to prevent progression of diabetes and manage metabolic syndrome, with BMI > 50. Copay for Ozempic  appears to be affordable this year. Patient is agreeable to initiating therapy. - Last UACR March 2025 - 17 mg/g - Patient denies personal or family history of multiple endocrine neoplasia type 2, medullary thyroid cancer; personal history of pancreatitis or gallbladder disease. - Reviewed long term cardiovascular and renal outcomes of uncontrolled blood sugar - Reviewed goal A1c, goal fasting, and goal 2 hour post prandial glucose - Reviewed dietary modifications including  utilizing the healthy plate method, limiting  portion size of carbohydrate foods, increasing intake of protein and non-starchy vegetables. Counseled patient to stay hydrated with water throughout the day. - Reviewed lifestyle modifications including: aiming for 150 minutes of moderate intensity exercise every week.  - Recommend to START Ozempic  0.25 mg injected into the skin once weekly x4 weeks then increase to 0.5 mg weekly thereafter. - Next A1C due 11/06/24   Hyperlipidemia/ASCVD Risk Reduction: - Currently controlled with most recent LDL-C of 91 mg/dL below goal < 899 mg/dL given U7IF and age < 40.    Patient verbalized understanding of treatment plan.   Follow Up Plan:  Pharmacist tin person 09/13/24 PCP clinic visit 09/25/24   Lorain Baseman, PharmD [facility] [phone]  "

## 2024-08-10 ENCOUNTER — Ambulatory Visit: Payer: Self-pay

## 2024-08-10 LAB — BASIC METABOLIC PANEL WITH GFR
BUN/Creatinine Ratio: 8 — ABNORMAL LOW (ref 9–20)
BUN: 7 mg/dL (ref 6–20)
CO2: 24 mmol/L (ref 20–29)
Calcium: 9.1 mg/dL (ref 8.7–10.2)
Chloride: 103 mmol/L (ref 96–106)
Creatinine, Ser: 0.83 mg/dL (ref 0.76–1.27)
Glucose: 91 mg/dL (ref 70–99)
Potassium: 3.9 mmol/L (ref 3.5–5.2)
Sodium: 142 mmol/L (ref 134–144)
eGFR: 115 mL/min/{1.73_m2}

## 2024-09-13 ENCOUNTER — Ambulatory Visit: Payer: Self-pay

## 2024-09-25 ENCOUNTER — Ambulatory Visit: Payer: Self-pay | Admitting: Nurse Practitioner
